# Patient Record
Sex: Male | Born: 1965
Health system: Southern US, Community
[De-identification: ages and names within clinical notes are randomized; demographics above are authoritative.]

## PROBLEM LIST (undated history)

## (undated) DIAGNOSIS — I471 Supraventricular tachycardia, unspecified: Secondary | ICD-10-CM

## (undated) DIAGNOSIS — F419 Anxiety disorder, unspecified: Secondary | ICD-10-CM

## (undated) DIAGNOSIS — I499 Cardiac arrhythmia, unspecified: Secondary | ICD-10-CM

## (undated) DIAGNOSIS — K219 Gastro-esophageal reflux disease without esophagitis: Secondary | ICD-10-CM

## (undated) HISTORY — PX: BACK SURGERY: SHX140

## (undated) HISTORY — DX: Supraventricular tachycardia: I47.1

## (undated) HISTORY — DX: Supraventricular tachycardia, unspecified: I47.10

## (undated) HISTORY — PX: TONSILLECTOMY: SUR1361

## (undated) HISTORY — DX: Gastro-esophageal reflux disease without esophagitis: K21.9

## (undated) HISTORY — DX: Anxiety disorder, unspecified: F41.9

---

## 2004-04-01 ENCOUNTER — Encounter: Admission: RE | Admit: 2004-04-01 | Discharge: 2004-04-01 | Payer: Self-pay | Admitting: General Surgery

## 2004-08-24 ENCOUNTER — Ambulatory Visit: Payer: Self-pay | Admitting: Gastroenterology

## 2004-09-11 HISTORY — PX: COLONOSCOPY: SHX174

## 2004-12-10 HISTORY — PX: LUMBAR DISC SURGERY: SHX700

## 2005-01-05 ENCOUNTER — Ambulatory Visit: Payer: Self-pay

## 2005-08-08 ENCOUNTER — Other Ambulatory Visit: Payer: Self-pay

## 2005-08-09 ENCOUNTER — Inpatient Hospital Stay: Payer: Self-pay | Admitting: Internal Medicine

## 2007-06-03 ENCOUNTER — Emergency Department (HOSPITAL_COMMUNITY): Admission: EM | Admit: 2007-06-03 | Discharge: 2007-06-03 | Payer: Self-pay | Admitting: Family Medicine

## 2009-01-20 ENCOUNTER — Encounter: Admission: RE | Admit: 2009-01-20 | Discharge: 2009-01-20 | Payer: Self-pay | Admitting: Internal Medicine

## 2009-11-16 ENCOUNTER — Emergency Department: Payer: Self-pay | Admitting: Emergency Medicine

## 2013-03-03 ENCOUNTER — Ambulatory Visit (INDEPENDENT_AMBULATORY_CARE_PROVIDER_SITE_OTHER): Payer: Managed Care, Other (non HMO) | Admitting: Family Medicine

## 2013-03-03 ENCOUNTER — Encounter: Payer: Self-pay | Admitting: Family Medicine

## 2013-03-03 VITALS — BP 118/82 | Temp 97.8°F | Resp 60 | Ht 68.0 in | Wt 187.0 lb

## 2013-03-03 DIAGNOSIS — I471 Supraventricular tachycardia, unspecified: Secondary | ICD-10-CM | POA: Insufficient documentation

## 2013-03-03 DIAGNOSIS — R5383 Other fatigue: Secondary | ICD-10-CM | POA: Insufficient documentation

## 2013-03-03 DIAGNOSIS — F411 Generalized anxiety disorder: Secondary | ICD-10-CM | POA: Insufficient documentation

## 2013-03-03 DIAGNOSIS — L600 Ingrowing nail: Secondary | ICD-10-CM

## 2013-03-03 DIAGNOSIS — R5381 Other malaise: Secondary | ICD-10-CM | POA: Insufficient documentation

## 2013-03-03 MED ORDER — FLUOCINONIDE-E 0.05 % EX CREA: TOPICAL_CREAM | Freq: Two times a day (BID) | CUTANEOUS | Status: DC

## 2013-03-03 NOTE — Progress Notes (Signed)
  Subjective:    Patient ID: Jesse Neal, male    DOB: [birth year], 47 y.o.   MRN: [id number]  HPI  Very pleasant 47 yo male here to establish care.  ?Low T- was told by previous PCP that testosterone was low but he never started testosterone replacement. He has had progressive fatigue and decreased sex drive this past year. No Cp, No SOB.  Anxiety- on celexa 10 mg daily, as needed clonazepam.  Has panic attacks but since starting celexa, these happen last frequently.  IBS- had a neg colonoscopy in 2006.  Total cholesterol checked at work - 174.  Paroxysmal SVT- followed by Dr. Waldron [facility].  Has had no issues- takes as needed metoprolol.  Patient Active Problem List   Diagnosis Date Noted  . Generalized anxiety disorder 03/03/2013  . Paroxysmal SVT (supraventricular tachycardia) 03/03/2013  . Other malaise and fatigue 03/03/2013  . Ingrown toenail 03/03/2013   Past Medical History  Diagnosis Date  . Anxiety   . SVT (supraventricular tachycardia)     Dr. Waldron [facility]   Past Surgical History  Procedure Laterality Date  . Tonsillectomy    . Back surgery  12/2004   History  Substance Use Topics  . Smoking status: Never Smoker   . Smokeless tobacco: Not on file  . Alcohol Use: Not on file   Family History  Problem Relation Age of Onset  . Cancer Father 30    Prostate  . Hyperlipidemia Father    No Known Allergies No current outpatient prescriptions on file prior to visit.   No current facility-administered medications on file prior to visit.   The PMH, PSH, Social History, Family History, Medications, and allergies have been reviewed in [facility], and have been updated if relevant.   Review of Systems    See HPI No depression No difficulties sleeping Objective:   Physical Exam BP 118/82  Temp(Src) 97.8 F (36.6 C)  Resp 60  Ht 5\' 8"  (1.727 m)  Wt 187 lb (84.823 kg)  BMI 28.44 kg/m2 General:  Pleasant male in NAD Eyes:  PERRL Ears:  External ear exam shows no  significant lesions or deformities.  Otoscopic examination reveals clear canals, tympanic membranes are intact bilaterally without bulging, retraction, inflammation or discharge. Hearing is grossly normal bilaterally. Nose:  External nasal examination shows no deformity or inflammation. Nasal mucosa are pink and moist without lesions or exudates. Mouth:  Oral mucosa and oropharynx without lesions or exudates.  Teeth in good repair. Neck:  no carotid bruit or thyromegaly no cervical or supraclavicular lymphadenopathy  Lungs:  Normal respiratory effort, chest expands symmetrically. Lungs are clear to auscultation, no crackles or wheezes. Heart:  Normal rate and regular rhythm. S1 and S2 normal without gallop, murmur, click, rub or other extra sounds. Abdomen:  Bowel sounds positive,abdomen soft and non-tender without masses, organomegaly or hernias noted. Pulses:  R and L posterior tibial pulses are full and equal bilaterally  Extremities:  no edema     Assessment & Plan:  1. Generalized anxiety disorder Stable on current meds.    2. Paroxysmal SVT (supraventricular tachycardia) Stable with as needed metoprolol.  3. Other malaise and fatigue Will check morning labs.   - Testosterone - Testosterone, free, total - PSA - CBC with Differential - Prolactin  4. Ingrown toenail Refer to podiatry.

## 2013-03-03 NOTE — Patient Instructions (Addendum)
Good to see you. Please come back for morning labs at your convenience.  See a podiatrist about your nail- Dr. Al Corpus.    Good luck with your audit. Have a great trip to [place]!  Try lidex when you need it (no more than two weeks at a time).

## 2013-03-06 ENCOUNTER — Other Ambulatory Visit (INDEPENDENT_AMBULATORY_CARE_PROVIDER_SITE_OTHER): Payer: Managed Care, Other (non HMO)

## 2013-03-06 DIAGNOSIS — R5381 Other malaise: Secondary | ICD-10-CM

## 2013-03-06 LAB — CBC WITH DIFFERENTIAL/PLATELET
Basophils Absolute: 0 10*3/uL (ref 0.0–0.1)
Basophils Relative: 0.8 % (ref 0.0–3.0)
Eosinophils Absolute: 0.1 10*3/uL (ref 0.0–0.7)
Eosinophils Relative: 1.4 % (ref 0.0–5.0)
HCT: 47.4 % (ref 39.0–52.0)
Hemoglobin: 16.1 g/dL (ref 13.0–17.0)
Lymphocytes Relative: 35.9 % (ref 12.0–46.0)
Lymphs Abs: 2.2 10*3/uL (ref 0.7–4.0)
MCHC: 34 g/dL (ref 30.0–36.0)
MCV: 91 fl (ref 78.0–100.0)
Monocytes Absolute: 0.4 10*3/uL (ref 0.1–1.0)
Monocytes Relative: 6.1 % (ref 3.0–12.0)
Neutro Abs: 3.4 10*3/uL (ref 1.4–7.7)
Neutrophils Relative %: 55.8 % (ref 43.0–77.0)
Platelets: 215 10*3/uL (ref 150.0–400.0)
RBC: 5.21 Mil/uL (ref 4.22–5.81)
RDW: 13.1 % (ref 11.5–14.6)
WBC: 6.1 10*3/uL (ref 4.5–10.5)

## 2013-03-06 LAB — PSA: PSA: 0.56 ng/mL (ref 0.10–4.00)

## 2013-03-07 ENCOUNTER — Encounter: Payer: Self-pay | Admitting: Family Medicine

## 2013-03-07 LAB — TESTOSTERONE, FREE, TOTAL, SHBG
Sex Hormone Binding: 31 nmol/L (ref 13–71)
Testosterone, Free: 49 pg/mL (ref 47.0–244.0)
Testosterone-% Free: 2 % (ref 1.6–2.9)
Testosterone: 245 ng/dL — ABNORMAL LOW (ref 300–890)

## 2013-03-07 LAB — PROLACTIN: Prolactin: 5.1 ng/mL (ref 2.1–17.1)

## 2013-03-13 ENCOUNTER — Encounter: Payer: Self-pay | Admitting: Family Medicine

## 2013-03-13 NOTE — Telephone Encounter (Signed)
Pt left v/m that he does want to start testosterone gel and send rx to [facility]. Pt call back is [phone].

## 2013-03-17 ENCOUNTER — Encounter: Payer: Self-pay | Admitting: Family Medicine

## 2013-03-17 ENCOUNTER — Other Ambulatory Visit: Payer: Self-pay | Admitting: Family Medicine

## 2013-03-17 MED ORDER — TESTOSTERONE 50 MG/5GM (1%) TD GEL: 5.0000 g | Freq: Every day | TRANSDERMAL | Status: DC

## 2013-03-17 NOTE — Telephone Encounter (Signed)
androgel called to [facility].

## 2013-03-18 ENCOUNTER — Telehealth: Payer: Self-pay

## 2013-03-18 ENCOUNTER — Encounter: Payer: Self-pay | Admitting: Family Medicine

## 2013-03-18 NOTE — Telephone Encounter (Signed)
Sarah with [employer] left v/m requesting clarification on androgel; there are 2 formulations of androgel. Does Dr Dayton Martes want pt on 1% or 1.62 % androgel pump.Please advise.

## 2013-03-18 NOTE — Telephone Encounter (Signed)
1%- see my chart message that I forwarded.

## 2013-03-18 NOTE — Telephone Encounter (Signed)
Advised pharmacist at [facility].

## 2013-03-19 NOTE — Telephone Encounter (Signed)
Dr Dayton Martes, prior Berkley Harvey is needed for androgel.  Insurance prefers androderm patches, axiron or Immunologist.  Do you want to change or try to get prior auth.  I have sent patient a my chart message asking if he has tried anything else.

## 2013-03-20 ENCOUNTER — Encounter: Payer: Self-pay | Admitting: Family Medicine

## 2013-03-21 ENCOUNTER — Other Ambulatory Visit: Payer: Self-pay | Admitting: Family Medicine

## 2013-03-21 MED ORDER — TESTOSTERONE 30 MG/ACT TD SOLN: 60.0000 mg | Freq: Every morning | TRANSDERMAL | Status: DC

## 2013-03-21 NOTE — Progress Notes (Signed)
Dr. Dayton Martes, do you want to prescribe a different testosterone for this patient?

## 2013-03-21 NOTE — Telephone Encounter (Signed)
I sent that info to you in another phone note, but I know they said androderm patches, axiron or fortesta

## 2013-03-21 NOTE — Progress Notes (Signed)
Yes I tried to send it to.  I wanted axiron.  Sorry if I did not send it right.

## 2013-03-24 NOTE — Telephone Encounter (Signed)
O

## 2013-03-25 ENCOUNTER — Encounter: Payer: Self-pay | Admitting: Family Medicine

## 2013-03-26 NOTE — Progress Notes (Signed)
Testosterone patches called to [facility].

## 2013-05-20 ENCOUNTER — Ambulatory Visit (INDEPENDENT_AMBULATORY_CARE_PROVIDER_SITE_OTHER): Payer: Managed Care, Other (non HMO) | Admitting: Family Medicine

## 2013-05-20 ENCOUNTER — Encounter: Payer: Self-pay | Admitting: Family Medicine

## 2013-05-20 VITALS — BP 102/80 | HR 62 | Temp 98.2°F | Ht 68.0 in | Wt 194.8 lb

## 2013-05-20 DIAGNOSIS — S30860A Insect bite (nonvenomous) of lower back and pelvis, initial encounter: Secondary | ICD-10-CM

## 2013-05-20 DIAGNOSIS — J309 Allergic rhinitis, unspecified: Secondary | ICD-10-CM

## 2013-05-20 DIAGNOSIS — S30861A Insect bite (nonvenomous) of abdominal wall, initial encounter: Secondary | ICD-10-CM

## 2013-05-20 NOTE — Patient Instructions (Addendum)
Benadryl at bedtime, claritin during the day. Saline irrigation. Topical steroid... Apply twice daily. Call if not improving in 48-72 hours, call sooner if new symptoms (redness spreading).  Go to ER if shortness of breathing or lip/tounge swelling.

## 2013-05-20 NOTE — Progress Notes (Signed)
  Subjective:    Patient ID: Jesse Neal, male    DOB: [birth year], 47 y.o.   MRN: [id number]  HPI  47 year old male presents following insect bIte on right abdomen.  He was cleaning out garage.. Later that day he had ear ache, sore throat, itchy skin. Mild flu like symptoms, mild nausea. No fever. No SOB, no cough. Noted redness in well circumscribed are on right lower abdomen, super itchy. No throat swelling or tounge/lip swelling.  He has been using benadryl.  No sick contacts.  Review of Systems  Constitutional: Negative for fever and fatigue.  HENT: Positive for ear pain and congestion.   Eyes: Negative for pain.  Respiratory: Negative for cough, shortness of breath and wheezing.   Cardiovascular: Negative for chest pain, palpitations and leg swelling.  Gastrointestinal: Negative for abdominal pain.       Objective:   Physical Exam  Constitutional: Vital signs are normal. He appears well-developed and well-nourished.  HENT:  Head: Normocephalic.  Right Ear: Hearing normal. Tympanic membrane is not erythematous. A middle ear effusion is present.  Left Ear: Hearing normal. Tympanic membrane is not erythematous.  Nose: Mucosal edema present. No rhinorrhea. Right sinus exhibits no maxillary sinus tenderness and no frontal sinus tenderness. Left sinus exhibits no maxillary sinus tenderness and no frontal sinus tenderness.  Mouth/Throat: Mucous membranes are normal. No oropharyngeal exudate, posterior oropharyngeal edema, posterior oropharyngeal erythema or tonsillar abscesses.  Neck: Trachea normal. Carotid bruit is not present. No mass and no thyromegaly present.  Cardiovascular: Normal rate, regular rhythm and normal pulses.  Exam reveals no gallop, no distant heart sounds and no friction rub.   No murmur heard. No peripheral edema  Pulmonary/Chest: Effort normal and breath sounds normal. No respiratory distress.  Skin: Skin is warm, dry and intact. No rash noted.   Psychiatric: He has a normal mood and affect. His speech is normal and behavior is normal. Thought content normal.          Assessment & Plan:  Local reaction of insect bite with mild systemic response. Treat with oral antihistamine and local steroid cream. HEENT symptoms likely due to allergy response to cleaning out dusty garage... Treat with nasal saline irrigation and antihistamine.

## 2013-05-26 ENCOUNTER — Ambulatory Visit (INDEPENDENT_AMBULATORY_CARE_PROVIDER_SITE_OTHER): Payer: Managed Care, Other (non HMO) | Admitting: Family Medicine

## 2013-05-26 ENCOUNTER — Encounter: Payer: Self-pay | Admitting: Family Medicine

## 2013-05-26 VITALS — BP 112/80 | HR 64 | Temp 97.6°F | Wt 193.0 lb

## 2013-05-26 DIAGNOSIS — E291 Testicular hypofunction: Secondary | ICD-10-CM

## 2013-05-26 DIAGNOSIS — R7989 Other specified abnormal findings of blood chemistry: Secondary | ICD-10-CM | POA: Insufficient documentation

## 2013-05-26 DIAGNOSIS — Z79899 Other long term (current) drug therapy: Secondary | ICD-10-CM

## 2013-05-26 LAB — CBC WITH DIFFERENTIAL/PLATELET
Basophils Absolute: 0.1 10*3/uL (ref 0.0–0.1)
Basophils Relative: 0.9 % (ref 0.0–3.0)
Eosinophils Absolute: 0.1 10*3/uL (ref 0.0–0.7)
Eosinophils Relative: 2 % (ref 0.0–5.0)
HCT: 44.8 % (ref 39.0–52.0)
Hemoglobin: 15.4 g/dL (ref 13.0–17.0)
Lymphocytes Relative: 37.6 % (ref 12.0–46.0)
Lymphs Abs: 2.4 10*3/uL (ref 0.7–4.0)
MCHC: 34.4 g/dL (ref 30.0–36.0)
MCV: 88.9 fl (ref 78.0–100.0)
Monocytes Absolute: 0.5 10*3/uL (ref 0.1–1.0)
Monocytes Relative: 7.4 % (ref 3.0–12.0)
Neutro Abs: 3.3 10*3/uL (ref 1.4–7.7)
Neutrophils Relative %: 52.1 % (ref 43.0–77.0)
Platelets: 241 10*3/uL (ref 150.0–400.0)
RBC: 5.04 Mil/uL (ref 4.22–5.81)
RDW: 12.9 % (ref 11.5–14.6)
WBC: 6.3 10*3/uL (ref 4.5–10.5)

## 2013-05-26 LAB — COMPREHENSIVE METABOLIC PANEL
ALT: 26 U/L (ref 0–53)
AST: 26 U/L (ref 0–37)
Albumin: 4.5 g/dL (ref 3.5–5.2)
Alkaline Phosphatase: 57 U/L (ref 39–117)
BUN: 16 mg/dL (ref 6–23)
CO2: 29 mEq/L (ref 19–32)
Calcium: 9.4 mg/dL (ref 8.4–10.5)
Chloride: 103 mEq/L (ref 96–112)
Creatinine, Ser: 1 mg/dL (ref 0.4–1.5)
GFR: 85.47 mL/min (ref >60.00)
Glucose, Bld: 93 mg/dL (ref 70–99)
Potassium: 4.3 mEq/L (ref 3.5–5.1)
Sodium: 137 mEq/L (ref 135–145)
Total Bilirubin: 0.7 mg/dL (ref 0.3–1.2)
Total Protein: 7.2 g/dL (ref 6.0–8.3)

## 2013-05-26 LAB — LIPID PANEL
Cholesterol: 186 mg/dL (ref 0–200)
HDL: 36.7 mg/dL — ABNORMAL LOW (ref >39.00)
LDL Cholesterol: 126 mg/dL — ABNORMAL HIGH (ref 0–99)
Total CHOL/HDL Ratio: 5
Triglycerides: 115 mg/dL (ref 0.0–149.0)
VLDL: 23 mg/dL (ref 0.0–40.0)

## 2013-05-26 LAB — HEMOGLOBIN A1C: Hgb A1c MFr Bld: 5.5 % (ref 4.6–6.5)

## 2013-05-26 LAB — PSA: PSA: 0.56 ng/mL (ref 0.10–4.00)

## 2013-05-26 MED ORDER — CITALOPRAM HYDROBROMIDE 10 MG PO TABS: 10.0000 mg | ORAL_TABLET | Freq: Every day | ORAL | Status: DC

## 2013-05-26 MED ORDER — CLONAZEPAM 0.5 MG PO TABS: ORAL_TABLET | ORAL | Status: DC

## 2013-05-26 NOTE — Progress Notes (Signed)
Subjective:    Patient ID: Jesse Neal, male    DOB: 03-16-1966, 47 y.o.   MRN: [id number]  HPI  Very pleasant 47 yo male here to follow up low testosterone.  When he established care in 02/2013, was told by previous PCP that he had low T.  He was complaining at that time of progressive fatigue and decreased sex drive this past year. No Cp, No SOB.  Testosterone was low.  We started topical testosterone replacement therapy.  He feels nightly erections has improved and maybe he has a little more energy. Otherwise, he is not sure if it is making too much of a difference.  Lab Results  Component Value Date   TESTOSTERONE 245* 03/06/2013     Patient Active Problem List   Diagnosis Date Noted  . Low testosterone 05/26/2013  . Insect bite of abdominal wall with local reaction 05/20/2013  . Allergic rhinitis 05/20/2013  . Generalized anxiety disorder 03/03/2013  . Paroxysmal SVT (supraventricular tachycardia) 03/03/2013  . Other malaise and fatigue 03/03/2013  . Ingrown toenail 03/03/2013   Past Medical History  Diagnosis Date  . Anxiety   . SVT (supraventricular tachycardia)     Dr. Waldron Labs   Past Surgical History  Procedure Laterality Date  . Tonsillectomy    . Back surgery  12/2004   History  Substance Use Topics  . Smoking status: Never Smoker   . Smokeless tobacco: Never Used  . Alcohol Use: Yes     Comment: rare   Family History  Problem Relation Age of Onset  . Cancer Father 21    Prostate  . Hyperlipidemia Father    No Known Allergies Current Outpatient Prescriptions on File Prior to Visit  Medication Sig Dispense Refill  . aspirin 81 MG tablet Take 81 mg by mouth daily.      . citalopram (CELEXA) 10 MG tablet Take 10 mg by mouth daily.      . clonazePAM (KLONOPIN) 0.5 MG tablet Take one half to one tablet by mouth daily      . fluocinonide-emollient (LIDEX-E) 0.05 % cream Apply topically 2 (two) times daily.  30 g  0  . metoprolol tartrate  (LOPRESSOR) 25 MG tablet Take one by mouth daily as needed for tachycardia      . Testosterone 30 MG/ACT SOLN Place 60 mg onto the skin every morning.  90 mL  3   No current facility-administered medications on file prior to visit.   The PMH, PSH, Social History, Family History, Medications, and allergies have been reviewed in [facility], and have been updated if relevant.   Review of Systems    See HPI No depression No difficulties sleeping Objective:   Physical Exam  BP 112/80  Pulse 64  Temp(Src) 97.6 F (36.4 C)  Wt 193 lb (87.544 kg)  BMI 29.35 kg/m2 General:  Pleasant male in NAD Eyes:  PERRL Ears:  External ear exam shows no significant lesions or deformities.  Otoscopic examination reveals clear canals, tympanic membranes are intact bilaterally without bulging, retraction, inflammation or discharge. Hearing is grossly normal bilaterally. Nose:  External nasal examination shows no deformity or inflammation. Nasal mucosa are pink and moist without lesions or exudates. Mouth:  Oral mucosa and oropharynx without lesions or exudates.  Teeth in good repair. Neck:  no carotid bruit or thyromegaly no cervical or supraclavicular lymphadenopathy  Lungs:  Normal respiratory effort, chest expands symmetrically. Lungs are clear to auscultation, no crackles or wheezes. Heart:  Normal rate and  regular rhythm. S1 and S2 normal without gallop, murmur, click, rub or other extra sounds. Abdomen:  Bowel sounds positive,abdomen soft and non-tender without masses, organomegaly or hernias noted. Pulses:  R and L posterior tibial pulses are full and equal bilaterally  Extremities:  no edema     Assessment & Plan:  1. Low testosterone With symptomatic improvement.  Recheck labs today. See below.  2. Encounter for long-term (current) use of high-risk medication  - Testosterone, free, total - PSA - CBC with Differential - Hemoglobin A1c - Comprehensive metabolic panel - Lipid Panel

## 2013-05-26 NOTE — Patient Instructions (Addendum)
Good to see you. We will call you with your lab results.   

## 2013-05-27 ENCOUNTER — Encounter: Payer: Self-pay | Admitting: Family Medicine

## 2013-05-27 ENCOUNTER — Other Ambulatory Visit: Payer: Self-pay | Admitting: Family Medicine

## 2013-05-27 DIAGNOSIS — D229 Melanocytic nevi, unspecified: Secondary | ICD-10-CM

## 2013-05-27 LAB — TESTOSTERONE, FREE, TOTAL, SHBG
Sex Hormone Binding: 28 nmol/L (ref 13–71)
Testosterone, Free: 220.7 pg/mL (ref 47.0–244.0)
Testosterone-% Free: 2.5 % (ref 1.6–2.9)
Testosterone: 868 ng/dL (ref 300–890)

## 2013-05-28 ENCOUNTER — Encounter: Payer: Self-pay | Admitting: Family Medicine

## 2013-07-17 ENCOUNTER — Other Ambulatory Visit: Payer: Self-pay

## 2013-09-24 ENCOUNTER — Ambulatory Visit (INDEPENDENT_AMBULATORY_CARE_PROVIDER_SITE_OTHER): Payer: Managed Care, Other (non HMO) | Admitting: Family Medicine

## 2013-09-24 ENCOUNTER — Encounter: Payer: Self-pay | Admitting: Family Medicine

## 2013-09-24 VITALS — BP 120/78 | HR 61 | Temp 97.9°F | Wt 181.0 lb

## 2013-09-24 DIAGNOSIS — R5383 Other fatigue: Secondary | ICD-10-CM

## 2013-09-24 DIAGNOSIS — M549 Dorsalgia, unspecified: Secondary | ICD-10-CM | POA: Insufficient documentation

## 2013-09-24 DIAGNOSIS — R5381 Other malaise: Secondary | ICD-10-CM

## 2013-09-24 DIAGNOSIS — E291 Testicular hypofunction: Secondary | ICD-10-CM

## 2013-09-24 DIAGNOSIS — R7989 Other specified abnormal findings of blood chemistry: Secondary | ICD-10-CM

## 2013-09-24 DIAGNOSIS — M2550 Pain in unspecified joint: Secondary | ICD-10-CM | POA: Insufficient documentation

## 2013-09-24 LAB — COMPREHENSIVE METABOLIC PANEL
ALT: 20 U/L (ref 0–53)
AST: 21 U/L (ref 0–37)
Albumin: 4.6 g/dL (ref 3.5–5.2)
Alkaline Phosphatase: 56 U/L (ref 39–117)
BUN: 11 mg/dL (ref 6–23)
CO2: 31 mEq/L (ref 19–32)
Calcium: 9.6 mg/dL (ref 8.4–10.5)
Chloride: 103 mEq/L (ref 96–112)
Creatinine, Ser: 1.1 mg/dL (ref 0.4–1.5)
GFR: 78.03 mL/min (ref >60.00)
Glucose, Bld: 91 mg/dL (ref 70–99)
Potassium: 4.3 mEq/L (ref 3.5–5.1)
Sodium: 139 mEq/L (ref 135–145)
Total Bilirubin: 0.9 mg/dL (ref 0.3–1.2)
Total Protein: 7.2 g/dL (ref 6.0–8.3)

## 2013-09-24 LAB — SEDIMENTATION RATE: Sed Rate: 1 mm/hr (ref 0–22)

## 2013-09-24 LAB — CBC WITH DIFFERENTIAL/PLATELET
Basophils Absolute: 0.1 10*3/uL (ref 0.0–0.1)
Basophils Relative: 1.1 % (ref 0.0–3.0)
Eosinophils Absolute: 0.1 10*3/uL (ref 0.0–0.7)
Eosinophils Relative: 1.7 % (ref 0.0–5.0)
HCT: 47.1 % (ref 39.0–52.0)
Hemoglobin: 16.4 g/dL (ref 13.0–17.0)
Lymphocytes Relative: 37.6 % (ref 12.0–46.0)
Lymphs Abs: 2.1 10*3/uL (ref 0.7–4.0)
MCHC: 34.8 g/dL (ref 30.0–36.0)
MCV: 87.8 fl (ref 78.0–100.0)
Monocytes Absolute: 0.3 10*3/uL (ref 0.1–1.0)
Monocytes Relative: 6.1 % (ref 3.0–12.0)
Neutro Abs: 3 10*3/uL (ref 1.4–7.7)
Neutrophils Relative %: 53.5 % (ref 43.0–77.0)
Platelets: 226 10*3/uL (ref 150.0–400.0)
RBC: 5.37 Mil/uL (ref 4.22–5.81)
RDW: 13.1 % (ref 11.5–14.6)
WBC: 5.6 10*3/uL (ref 4.5–10.5)

## 2013-09-24 LAB — VITAMIN B12: Vitamin B-12: 335 pg/mL (ref 211–911)

## 2013-09-24 LAB — RHEUMATOID FACTOR: Rhuematoid fact SerPl-aCnc: 10 IU/mL (ref <=14)

## 2013-09-24 LAB — PSA: PSA: 0.55 ng/mL (ref 0.10–4.00)

## 2013-09-24 LAB — TESTOSTERONE: Testosterone: 272.79 ng/dL — ABNORMAL LOW (ref 350.00–890.00)

## 2013-09-24 MED ORDER — OXYCODONE-ACETAMINOPHEN 5-325 MG PO TABS: 1.0000 | ORAL_TABLET | ORAL | Status: DC | PRN

## 2013-09-24 NOTE — Assessment & Plan Note (Signed)
Recheck Testosterone today. Fatigue may be multifactorial.  See below.

## 2013-09-24 NOTE — Assessment & Plan Note (Signed)
Deteriorated.  I am concerned there could be another underlying issue, given arthralgias as well. Will check rheum labs today.  May need rheum referral depending on results. Orders Placed This Encounter  Procedures  . CBC with Differential  . Testosterone  . PSA  . Comprehensive metabolic panel  . Sedimentation Rate  . Rheumatoid Factor  . B. Burgdorfi Antibodies  . Vitamin B12  . Vitamin D, 25-hydroxy

## 2013-09-24 NOTE — Assessment & Plan Note (Signed)
Consistent with muscle strain/spasm. Continue remaining active, prn flexeril. Also given #25 roxicet for severe pain prn.  He is aware of sedation and addiction potential.

## 2013-09-24 NOTE — Progress Notes (Signed)
Subjective:    Patient ID: Jesse Neal, male    DOB: 15-Jul-1966, 48 y.o.   MRN: [id number]  HPI  Very pleasant 48 yo male here to follow up low testosterone with complaint of back pain, fatigue and polyarthralgias.  Low T- on androgel.  Initially felt it helped with energy and ED.  Erections have improved but still very fatigued.  At times, he has skipped doses because he feels it is not working.   Lab Results  Component Value Date   TESTOSTERONE 868 05/26/2013   Fatigue- sleeping ok.  Starting exercise more- walks everyday.  Has lost weight intentionally. Wt Readings from Last 3 Encounters:  09/24/13 181 lb (82.101 kg)  05/26/13 193 lb (87.544 kg)  05/20/13 194 lb 12 oz (88.338 kg)   No matter how much he sleeps, still very tired.  No CP or SOB.  Denies feeling depressed.  Poly arthralgias- last several months, he feels "achy all over."  Sometimes in his feet, shoulders, back, knees.  No known injuries.  Joints are never hot or red.  Low back pain- right sided back pain for a few days.  Feels like back spasms he has had in past.  Went to massage therapist yesterday which did help.  No tingling or weakness of extremities.   Lab Results  Component Value Date   WBC 6.3 05/26/2013   HGB 15.4 05/26/2013   HCT 44.8 05/26/2013   MCV 88.9 05/26/2013   PLT 241.0 05/26/2013   Lab Results  Component Value Date   PSA 0.56 05/26/2013   PSA 0.56 03/06/2013    Patient Active Problem List   Diagnosis Date Noted  . Back pain 09/24/2013  . Low testosterone 05/26/2013  . Allergic rhinitis 05/20/2013  . Generalized anxiety disorder 03/03/2013  . Paroxysmal SVT (supraventricular tachycardia) 03/03/2013   Past Medical History  Diagnosis Date  . Anxiety   . SVT (supraventricular tachycardia)     Dr. Gigi Gin   Past Surgical History  Procedure Laterality Date  . Tonsillectomy    . Back surgery  12/2004   History  Substance Use Topics  . Smoking status: Never Smoker   . Smokeless  tobacco: Never Used  . Alcohol Use: Yes     Comment: rare   Family History  Problem Relation Age of Onset  . Cancer Father 43    Prostate  . Hyperlipidemia Father    No Known Allergies Current Outpatient Prescriptions on File Prior to Visit  Medication Sig Dispense Refill  . aspirin 81 MG tablet Take 81 mg by mouth daily.      . citalopram (CELEXA) 10 MG tablet Take 1 tablet (10 mg total) by mouth daily.  90 tablet  3  . clonazePAM (KLONOPIN) 0.5 MG tablet Take one half to one tablet by mouth daily  30 tablet  0  . fluocinonide-emollient (LIDEX-E) 0.05 % cream Apply topically 2 (two) times daily.  30 g  0  . metoprolol tartrate (LOPRESSOR) 25 MG tablet Take one by mouth daily as needed for tachycardia      . Testosterone 30 MG/ACT SOLN Place 60 mg onto the skin every morning.  90 mL  3   No current facility-administered medications on file prior to visit.   The PMH, PSH, Social History, Family History, Medications, and allergies have been reviewed in [facility], and have been updated if relevant.   Review of Systems    See HPI No depression No difficulties sleeping Objective:   Physical Exam  BP 120/78  Pulse 61  Temp(Src) 97.9 F (36.6 C) (Oral)  Wt 181 lb (82.101 kg)  SpO2 99% General:  Pleasant male in NAD Eyes:  PERRL Ears:  External ear exam shows no significant lesions or deformities.  Otoscopic examination reveals clear canals, tympanic membranes are intact bilaterally without bulging, retraction, inflammation or discharge. Hearing is grossly normal bilaterally. Nose:  External nasal examination shows no deformity or inflammation. Nasal mucosa are pink and moist without lesions or exudates. Mouth:  Oral mucosa and oropharynx without lesions or exudates.  Teeth in good repair. Neck:  no carotid bruit or thyromegaly no cervical or supraclavicular lymphadenopathy  Lungs:  Normal respiratory effort, chest expands symmetrically. Lungs are clear to auscultation, no crackles or  wheezes. Heart:  Normal rate and regular rhythm. S1 and S2 normal without gallop, murmur, click, rub or other extra sounds. Abdomen:  Bowel sounds positive,abdomen soft and non-tender without masses, organomegaly or hernias noted. Pulses:  R and L posterior tibial pulses are full and equal bilaterally  Extremities:  no edema  No erythema of joints Tight paraspinous muscles, no TTP over spine, neg SLR bilaterally, normal reflexes Normal gait    Assessment & Plan:

## 2013-09-24 NOTE — Progress Notes (Signed)
Pre-visit discussion using our clinic review tool. No additional management support is needed unless otherwise documented below in the visit note.  

## 2013-09-24 NOTE — Assessment & Plan Note (Signed)
See above.  Start with labs today. The patient indicates understanding of these issues and agrees with the plan.

## 2013-09-24 NOTE — Patient Instructions (Signed)
Great to see you. I will call you with your lab results- probably on Friday.  At that point, we can decide what our next step should be. Ok to take flexeril or percocet as needed.

## 2013-09-25 LAB — VITAMIN D 25 HYDROXY (VIT D DEFICIENCY, FRACTURES): Vit D, 25-Hydroxy: 40 ng/mL (ref 30–89)

## 2013-09-25 LAB — B. BURGDORFI ANTIBODIES: B burgdorferi Ab IgG+IgM: 0.41 {ISR}

## 2013-09-26 ENCOUNTER — Encounter: Payer: Self-pay | Admitting: Family Medicine

## 2013-09-30 ENCOUNTER — Telehealth: Payer: Self-pay

## 2013-09-30 MED ORDER — TESTOSTERONE 50 MG/5GM (1%) TD GEL: 5.0000 g | Freq: Every day | TRANSDERMAL | Status: DC

## 2013-09-30 NOTE — Telephone Encounter (Signed)
Spoke to pt and informed him that Rx has been phoned in to requested pharmacy

## 2013-09-30 NOTE — Telephone Encounter (Signed)
[employer] request clarification of name brand for testosterone gel. Does Dr Deborra Medina want Testim (not covered by insurance) or Axiron(last testosterone pt was on). [place] request cb.

## 2013-10-01 NOTE — Telephone Encounter (Signed)
Spoke to [employer] who states that a pre-auth is required for all testosterone and she will fax it to the office for completion

## 2013-10-01 NOTE — Telephone Encounter (Signed)
He felt axiron was not working well for him.  Is there another testosterone prep his insurance would cover (not injection)?

## 2013-10-07 ENCOUNTER — Other Ambulatory Visit: Payer: Self-pay | Admitting: Family Medicine

## 2013-10-07 MED ORDER — TESTOSTERONE 30 MG/ACT TD SOLN: 60.0000 mg | Freq: Every morning | TRANSDERMAL | Status: DC

## 2013-10-11 ENCOUNTER — Other Ambulatory Visit: Payer: Self-pay | Admitting: Family Medicine

## 2013-10-20 ENCOUNTER — Other Ambulatory Visit: Payer: Self-pay | Admitting: Family Medicine

## 2013-10-20 NOTE — Progress Notes (Signed)
Please call in testosterone rx as entered below.

## 2013-10-22 MED ORDER — TESTOSTERONE 30 MG/ACT TD SOLN: 60.0000 mg | Freq: Every morning | TRANSDERMAL | Status: DC

## 2013-10-22 NOTE — Telephone Encounter (Signed)
Lm on pts vm and informed him Rx has been called into requested pharmacy

## 2013-11-12 ENCOUNTER — Encounter: Payer: Self-pay | Admitting: Family Medicine

## 2013-12-11 ENCOUNTER — Other Ambulatory Visit: Payer: Self-pay | Admitting: Family Medicine

## 2013-12-11 MED ORDER — CITALOPRAM HYDROBROMIDE 10 MG PO TABS: 10.0000 mg | ORAL_TABLET | Freq: Every day | ORAL | Status: DC

## 2013-12-11 MED ORDER — CLONAZEPAM 0.5 MG PO TABS: ORAL_TABLET | ORAL | Status: DC

## 2013-12-11 NOTE — Telephone Encounter (Signed)
Lm on pts vm informing him Rx has been called in to requested pharmacy; also responded to through [employer]

## 2013-12-11 NOTE — Telephone Encounter (Signed)
Pt requesting medication refill. Last ov 09/2013 with no future appt scheduled. pls advise 

## 2014-01-15 ENCOUNTER — Other Ambulatory Visit: Payer: Self-pay | Admitting: Family Medicine

## 2014-01-15 ENCOUNTER — Encounter: Payer: Self-pay | Admitting: Family Medicine

## 2014-01-15 ENCOUNTER — Ambulatory Visit (INDEPENDENT_AMBULATORY_CARE_PROVIDER_SITE_OTHER): Payer: Managed Care, Other (non HMO) | Admitting: Family Medicine

## 2014-01-15 ENCOUNTER — Ambulatory Visit (INDEPENDENT_AMBULATORY_CARE_PROVIDER_SITE_OTHER)
Admission: RE | Admit: 2014-01-15 | Discharge: 2014-01-15 | Disposition: A | Discharge status: Home / Self Care | Payer: Managed Care, Other (non HMO) | Source: Ambulatory Visit | Attending: Family Medicine | Admitting: Family Medicine

## 2014-01-15 VITALS — BP 116/68 | HR 72 | Temp 97.9°F | Ht 68.0 in | Wt 180.2 lb

## 2014-01-15 DIAGNOSIS — IMO0002 Reserved for concepts with insufficient information to code with codable children: Secondary | ICD-10-CM

## 2014-01-15 DIAGNOSIS — M5416 Radiculopathy, lumbar region: Secondary | ICD-10-CM

## 2014-01-15 DIAGNOSIS — M549 Dorsalgia, unspecified: Secondary | ICD-10-CM

## 2014-01-15 MED ORDER — OXYCODONE-ACETAMINOPHEN 5-325 MG PO TABS: 1.0000 | ORAL_TABLET | ORAL | Status: DC | PRN

## 2014-01-15 MED ORDER — PREDNISONE 10 MG PO TABS: ORAL_TABLET | ORAL | Status: DC

## 2014-01-15 NOTE — Patient Instructions (Signed)
Please take prednisone as directed- with food and in the morning.  We will call you with your xray results and physical therapy appointment.

## 2014-01-15 NOTE — Progress Notes (Signed)
Pre visit review using our clinic review tool, if applicable. No additional management support is needed unless otherwise documented below in the visit note. 

## 2014-01-15 NOTE — Telephone Encounter (Signed)
Sent patient message back thru my-chart, that prescription is ready for pick-up and will be at the front desk.  

## 2014-01-15 NOTE — Progress Notes (Signed)
SUBJECTIVE:  Jesse Neal is a 48 y.o. male who complains of low back pain for 5 month(s), positional with bending or lifting, with radiation down the legs. Precipitating factors: none recalled by the patient. Prior history of back problems: recurrent self limited episodes of low back pain in the past and previous spinal surgery - diskectomy in 2006. There is no numbness in the legs.  Patient Active Problem List   Diagnosis Date Noted  . Lumbar back pain with radiculopathy affecting right lower extremity 01/15/2014  . Back pain 09/24/2013  . Fatigue 09/24/2013  . Polyarthralgia 09/24/2013  . Low testosterone 05/26/2013  . Allergic rhinitis 05/20/2013  . Generalized anxiety disorder 03/03/2013  . Paroxysmal SVT (supraventricular tachycardia) 03/03/2013   Past Medical History  Diagnosis Date  . Anxiety   . SVT (supraventricular tachycardia)     Dr. Gigi Gin   Past Surgical History  Procedure Laterality Date  . Tonsillectomy    . Back surgery  12/2004   History  Substance Use Topics  . Smoking status: Never Smoker   . Smokeless tobacco: Never Used  . Alcohol Use: Yes     Comment: rare   Family History  Problem Relation Age of Onset  . Cancer Father 3    Prostate  . Hyperlipidemia Father    No Known Allergies Current Outpatient Prescriptions on File Prior to Visit  Medication Sig Dispense Refill  . aspirin 81 MG tablet Take 81 mg by mouth daily.      . citalopram (CELEXA) 10 MG tablet Take 1 tablet (10 mg total) by mouth daily.  90 tablet  3  . clonazePAM (KLONOPIN) 0.5 MG tablet Take one half to one tablet by mouth daily  30 tablet  0  . fluocinonide-emollient (LIDEX-E) 0.05 % cream Apply topically 2 (two) times daily.  30 g  0  . metoprolol tartrate (LOPRESSOR) 25 MG tablet Take one by mouth daily as needed for tachycardia      . oxyCODONE-acetaminophen (ROXICET) 5-325 MG per tablet Take 1-2 tablets by mouth every 4 (four) hours as needed for severe pain.  25 tablet   0  . Testosterone 30 MG/ACT SOLN Place 60 mg onto the skin every morning.  90 mL  3   No current facility-administered medications on file prior to visit.   The PMH, PSH, Social History, Family History, Medications, and allergies have been reviewed in [facility], and have been updated if relevant.  OBJECTIVE: BP 116/68  Pulse 72  Temp(Src) 97.9 F (36.6 C) (Oral)  Ht 5\' 8"  (1.727 m)  Wt 180 lb 4 oz (81.761 kg)  BMI 27.41 kg/m2  SpO2 97%  Patient appears to be in mild to moderate pain, antalgic gait noted. Lumbosacral spine area reveals no local tenderness or mass.  Painful and reduced LS ROM noted. Straight leg raise is positive at 90 degrees on right. DTR's, motor strength and sensation normal, including heel and toe gait.  Peripheral pulses are palpable. X-Ray: ordered, but results not yet available.  ASSESSMENT:  herniated disc likely at L4-5 and with radiculopathy  PLAN: Prednisone taper, xray, PT referral. For acute pain, rest, intermittent application of heat (do not sleep on heating pad), analgesics and muscle relaxants are recommended.  May need MRI given h/o surgery. The patient indicates understanding of these issues and agrees with the plan.

## 2014-01-15 NOTE — Telephone Encounter (Signed)
Yes ok to take with prednisone.

## 2014-01-19 NOTE — Telephone Encounter (Signed)
This was back in Jan before the clinical staff started doing them. Do you know anything about this?

## 2014-01-21 NOTE — Telephone Encounter (Signed)
I went through pt's mychart messages, and it appears an [place] was never done. The pt decided to change med instead of obtaining an [place] for the old med.

## 2014-02-17 ENCOUNTER — Other Ambulatory Visit: Payer: Self-pay | Admitting: Family Medicine

## 2014-05-21 ENCOUNTER — Ambulatory Visit (INDEPENDENT_AMBULATORY_CARE_PROVIDER_SITE_OTHER): Payer: Managed Care, Other (non HMO) | Admitting: Family Medicine

## 2014-05-21 ENCOUNTER — Encounter: Payer: Self-pay | Admitting: Family Medicine

## 2014-05-21 VITALS — BP 100/70 | HR 75 | Temp 97.7°F | Wt 188.0 lb

## 2014-05-21 DIAGNOSIS — J0120 Acute ethmoidal sinusitis, unspecified: Secondary | ICD-10-CM

## 2014-05-21 MED ORDER — HYDROCODONE-HOMATROPINE 5-1.5 MG/5ML PO SYRP: 5.0000 mL | ORAL_SOLUTION | Freq: Three times a day (TID) | ORAL | Status: DC | PRN

## 2014-05-21 MED ORDER — AMOXICILLIN-POT CLAVULANATE 875-125 MG PO TABS: 1.0000 | ORAL_TABLET | Freq: Two times a day (BID) | ORAL | Status: AC

## 2014-05-21 NOTE — Progress Notes (Signed)
SUBJECTIVE:  Jesse Neal is a 48 y.o. male who complains of coryza, congestion, sneezing, sore throat, myalgias, headache and bilateral sinus pain for 8 days. He denies a history of chest pain, shortness of breath and vomiting and denies a history of asthma. Patient denies smoke cigarettes.   Current Outpatient Prescriptions on File Prior to Visit  Medication Sig Dispense Refill  . citalopram (CELEXA) 10 MG tablet Take 1 tablet (10 mg total) by mouth daily.  90 tablet  3  . clonazePAM (KLONOPIN) 0.5 MG tablet Take one half to one tablet by mouth daily  30 tablet  0   No current facility-administered medications on file prior to visit.    No Known Allergies  Past Medical History  Diagnosis Date  . Anxiety   . SVT (supraventricular tachycardia)     Dr. Gigi Gin    Past Surgical History  Procedure Laterality Date  . Tonsillectomy    . Back surgery  12/2004    Family History  Problem Relation Age of Onset  . Cancer Father 15    Prostate  . Hyperlipidemia Father     History   Social History  . Marital Status: Married    Spouse Name: N/A    Number of Children: N/A  . Years of Education: N/A   Occupational History  . Not on file.   Social History Main Topics  . Smoking status: Never Smoker   . Smokeless tobacco: Never Used  . Alcohol Use: Yes     Comment: rare  . Drug Use: No  . Sexual Activity: Not on file   Other Topics Concern  . 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   The PMH, PSH, Social History, Family History, Medications, and allergies have been reviewed in [facility], and have been updated if relevant.  OBJECTIVE: BP 100/70  Pulse 75  Temp(Src) 97.7 F (36.5 C) (Tympanic)  Wt 188 lb (85.276 kg)  SpO2 98%  He appears well, vital signs are as noted. Ears normal.  Throat and pharynx normal.  Neck supple. No adenopathy in the neck. Nose is congested. Sinuses tender. The chest is clear,  without wheezes or rales.  ASSESSMENT:  sinusitis  PLAN: Given duration and progression of symptoms, will treat for bacterial sinusitis with Augmentin. Rx given to pt for prn hycodan- discussed sedation precautions.  Symptomatic therapy suggested: push fluids, rest and return office visit prn if symptoms persist or worsen. Call or return to clinic prn if these symptoms worsen or fail to improve as anticipated.

## 2014-05-21 NOTE — Patient Instructions (Signed)
Great to see you. Good luck with your new job.  Take Augmentin as directed as directed.  Drink lots of fluids.    Treat sympotmatically with Mucinex, nasal saline irrigation, and Tylenol/Ibuprofen.  Continue  over the counter nasocort-start with 2 sprays per nostril per day...and then try to taper to 1 spray per nostril once symptoms improve.   You can use warm compresses.  Cough suppressant at night.   Call if not improving as expected in 5-7 days.

## 2014-05-21 NOTE — Progress Notes (Signed)
Pre visit review using our clinic review tool, if applicable. No additional management support is needed unless otherwise documented below in the visit note. 

## 2014-06-26 ENCOUNTER — Other Ambulatory Visit: Payer: Self-pay

## 2014-06-30 ENCOUNTER — Encounter: Payer: Self-pay | Admitting: Internal Medicine

## 2014-08-10 ENCOUNTER — Encounter: Payer: Self-pay | Admitting: Family Medicine

## 2014-08-10 ENCOUNTER — Ambulatory Visit (INDEPENDENT_AMBULATORY_CARE_PROVIDER_SITE_OTHER): Payer: BC Managed Care – PPO | Admitting: Family Medicine

## 2014-08-10 VITALS — BP 122/70 | HR 73 | Temp 97.7°F | Wt 193.0 lb

## 2014-08-10 DIAGNOSIS — M5441 Lumbago with sciatica, right side: Secondary | ICD-10-CM

## 2014-08-10 DIAGNOSIS — Z23 Encounter for immunization: Secondary | ICD-10-CM

## 2014-08-10 MED ORDER — OXYCODONE-ACETAMINOPHEN 5-325 MG PO TABS: 1.0000 | ORAL_TABLET | ORAL | Status: DC | PRN

## 2014-08-10 MED ORDER — PREDNISONE 10 MG PO TABS: ORAL_TABLET | ORAL | Status: DC

## 2014-08-10 NOTE — Assessment & Plan Note (Signed)
New- he did get some relief with massage this am. Prednisone course, percocet as needed for severe pain. Start home exercises he learned at PT. Call or return to clinic prn if these symptoms worsen or fail to improve as anticipated. The patient indicates understanding of these issues and agrees with the plan.

## 2014-08-10 NOTE — Patient Instructions (Signed)
Great to see you. Take prednisone as directed, Roxicet as needed for severe pain.  Start your home exercises.

## 2014-08-10 NOTE — Progress Notes (Signed)
SUBJECTIVE:  Jesse Neal is a 48 y.o. male who complains of low back pain for 3 day(s), positional with bending or lifting, with radiation down the legs. Precipitating factors: none recalled by the patient. Prior history of back problems: recurrent self limited episodes of low back pain in the past. There is no numbness in the legs.  I saw him in May 2015 for similar issue. Given prednisone, PT referral and Xray done. Did feel PT was helpful.  Xray from 01/15/14 showed: CLINICAL DATA: Low back pain.  EXAM: LUMBAR SPINE - COMPLETE 4+ VIEW  COMPARISON: None.  FINDINGS: Diffuse degenerative change with multilevel disc degeneration and endplate osteophyte formation. No acute abnormality identified. No evidence of fracture. Good anatomic alignment. Normal bony mineralization.  IMPRESSION: Diffuse degenerative change. No acute abnormality identified.  Current Outpatient Prescriptions on File Prior to Visit  Medication Sig Dispense Refill  . citalopram (CELEXA) 10 MG tablet Take 1 tablet (10 mg total) by mouth daily. 90 tablet 3  . clonazePAM (KLONOPIN) 0.5 MG tablet Take one half to one tablet by mouth daily 30 tablet 0   No current facility-administered medications on file prior to visit.    No Known Allergies  Past Medical History  Diagnosis Date  . Anxiety   . SVT (supraventricular tachycardia)     Dr. Gigi Gin    Past Surgical History  Procedure Laterality Date  . Tonsillectomy    . Back surgery  12/2004    Family History  Problem Relation Age of Onset  . Cancer Father 47    Prostate  . Hyperlipidemia Father     History   Social History  . Marital Status: Married    Spouse Name: N/A    Number of Children: N/A  . Years of Education: N/A   Occupational History  . Not on file.   Social History Main Topics  . Smoking status: Never Smoker   . Smokeless tobacco: Never Used  . Alcohol Use: Yes     Comment: rare  . Drug Use: No  . Sexual  Activity: Not on file   Other Topics Concern  . 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   The PMH, PSH, Social History, Family History, Medications, and allergies have been reviewed in [facility], and have been updated if relevant.  OBJECTIVE: BP 122/70 mmHg  Pulse 73  Temp(Src) 97.7 F (36.5 C) (Oral)  Wt 193 lb (87.544 kg)  SpO2 97%  Patient appears to be in mild to moderate pain, antalgic gait noted. Lumbosacral spine area reveals no local tenderness or mass.  Painful and reduced LS ROM noted. Straight leg raise is negative at 90 degrees on right. DTR's, motor strength and sensation normal, including heel and toe gait.  Peripheral pulses are palpable. X-Ray: not indicated.

## 2014-08-10 NOTE — Progress Notes (Signed)
Pre visit review using our clinic review tool, if applicable. No additional management support is needed unless otherwise documented below in the visit note. 

## 2014-08-24 ENCOUNTER — Encounter: Payer: Self-pay | Admitting: Internal Medicine

## 2014-09-06 IMAGING — CR DG LUMBAR SPINE COMPLETE 4+V
5 series · 5 of 5 positions shown · non-contrast
Comparison: None.

CLINICAL DATA: Low back pain.

EXAM:
LUMBAR SPINE - COMPLETE 4+ VIEW

[view not recorded (1 of 5)]
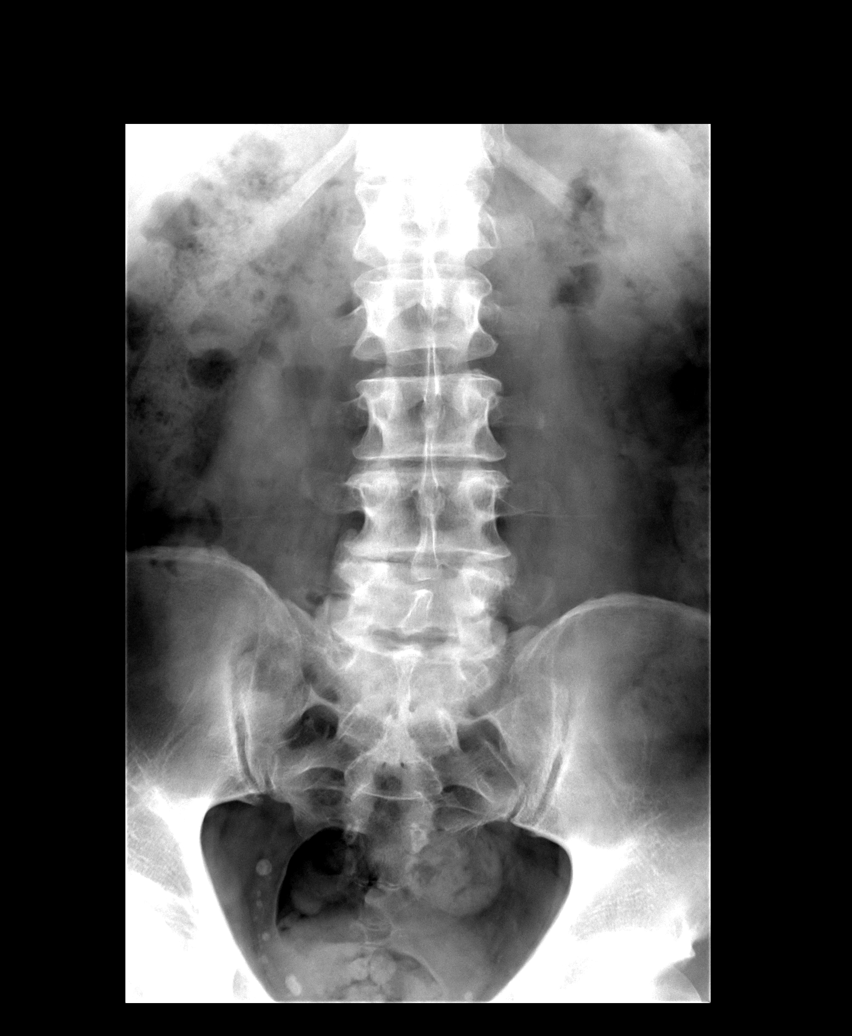

[view not recorded (2 of 5)]
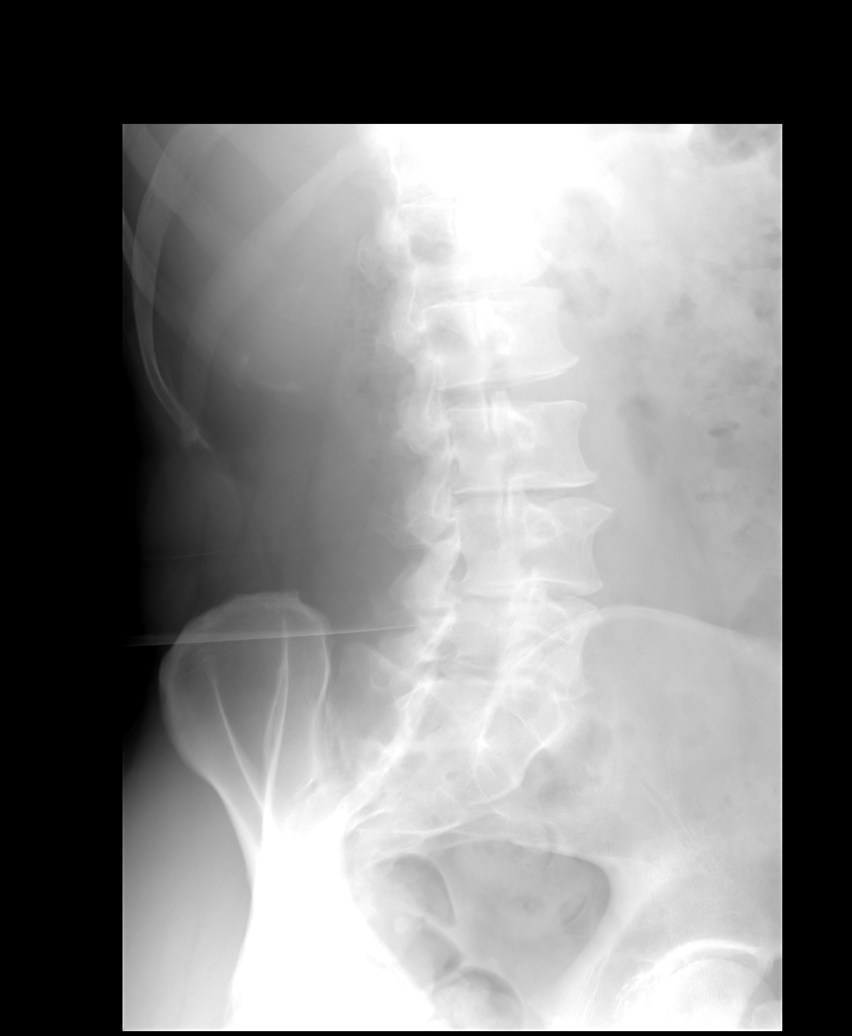

[view not recorded (3 of 5)]
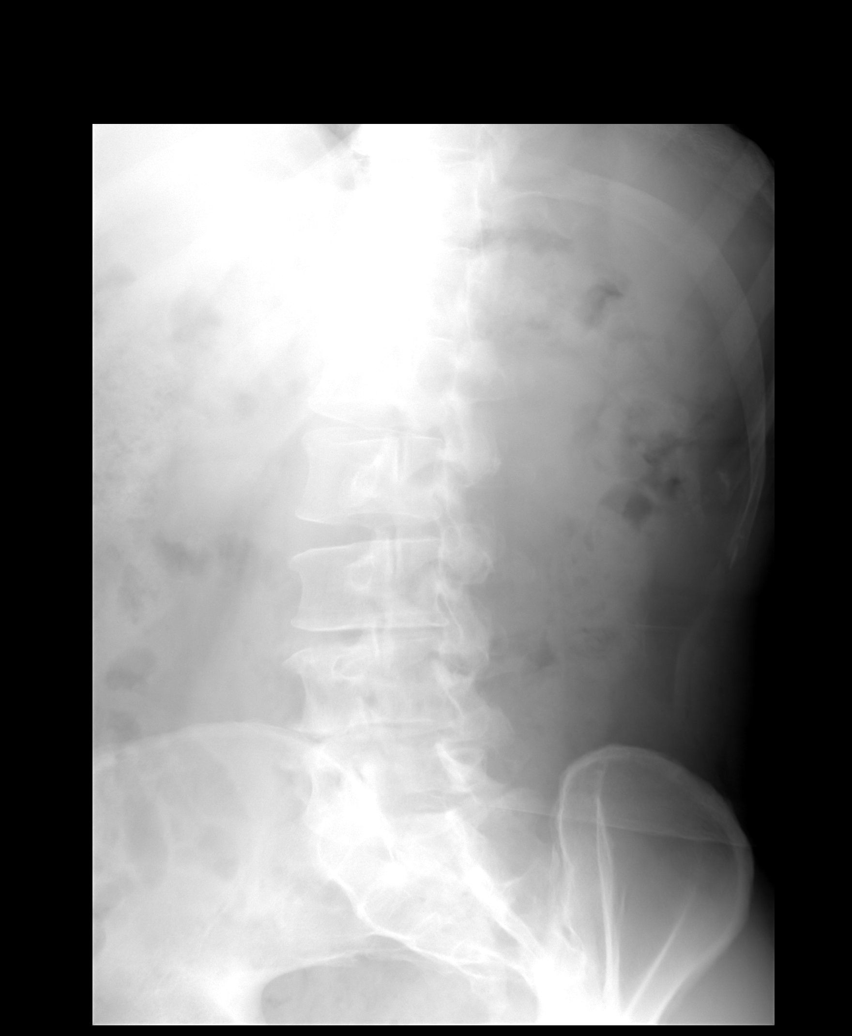

[view not recorded (4 of 5)]
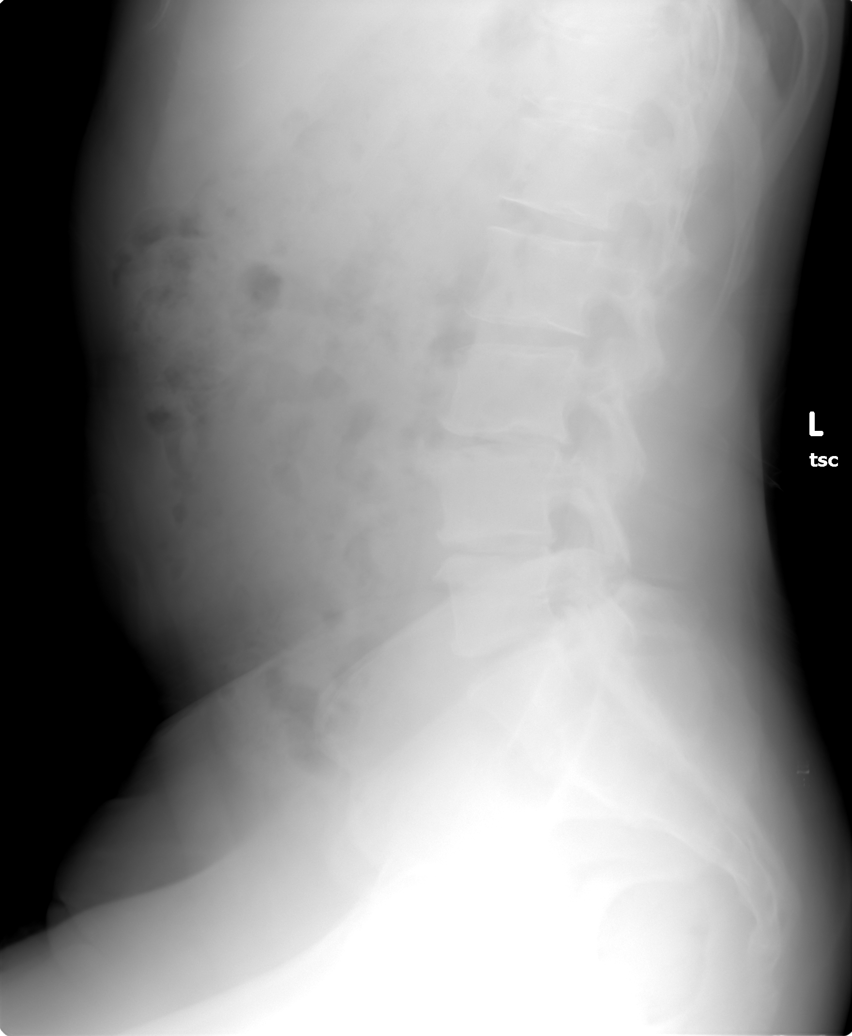

[view not recorded (5 of 5)]
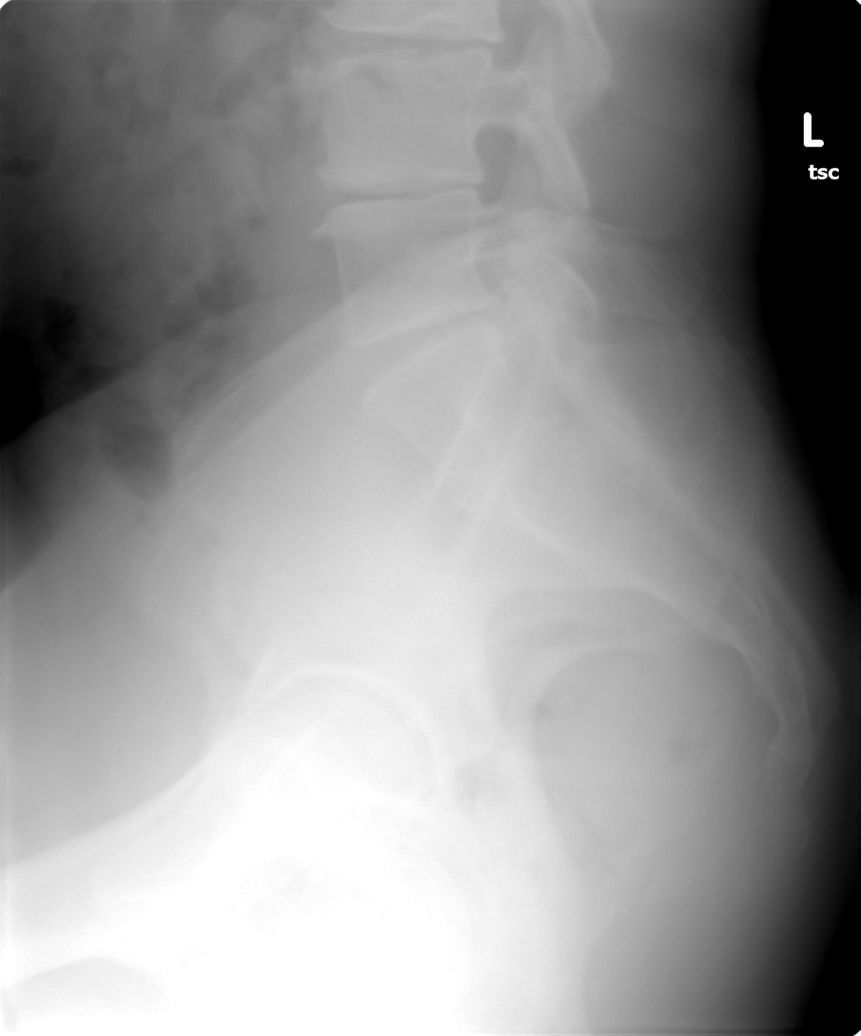

[5 of 5 positions shown; findings below may reference images not displayed]

FINDINGS: Diffuse degenerative change with multilevel disc degeneration and
endplate osteophyte formation. No acute abnormality identified. No
evidence of fracture. Good anatomic alignment. Normal bony
mineralization.
IMPRESSION: Diffuse degenerative change.  No acute abnormality identified.

## 2014-11-27 ENCOUNTER — Other Ambulatory Visit: Payer: Self-pay | Admitting: Family Medicine

## 2014-11-27 ENCOUNTER — Other Ambulatory Visit (INDEPENDENT_AMBULATORY_CARE_PROVIDER_SITE_OTHER): Payer: BLUE CROSS/BLUE SHIELD

## 2014-11-27 DIAGNOSIS — Z0000 Encounter for general adult medical examination without abnormal findings: Secondary | ICD-10-CM

## 2014-11-27 LAB — LIPID PANEL
Cholesterol: 191 mg/dL (ref 0–200)
HDL: 39.4 mg/dL (ref >39.00)
LDL Cholesterol: 132 mg/dL — ABNORMAL HIGH (ref 0–99)
NonHDL: 151.6
Total CHOL/HDL Ratio: 5
Triglycerides: 98 mg/dL (ref 0.0–149.0)
VLDL: 19.6 mg/dL (ref 0.0–40.0)

## 2014-11-27 LAB — COMPREHENSIVE METABOLIC PANEL
ALT: 18 U/L (ref 0–53)
AST: 17 U/L (ref 0–37)
Albumin: 4.5 g/dL (ref 3.5–5.2)
Alkaline Phosphatase: 59 U/L (ref 39–117)
BUN: 17 mg/dL (ref 6–23)
CO2: 31 mEq/L (ref 19–32)
Calcium: 9.3 mg/dL (ref 8.4–10.5)
Chloride: 105 mEq/L (ref 96–112)
Creatinine, Ser: 1.07 mg/dL (ref 0.40–1.50)
GFR: 77.66 mL/min (ref >60.00)
Glucose, Bld: 99 mg/dL (ref 70–99)
Potassium: 4.3 mEq/L (ref 3.5–5.1)
Sodium: 139 mEq/L (ref 135–145)
Total Bilirubin: 0.6 mg/dL (ref 0.2–1.2)
Total Protein: 6.8 g/dL (ref 6.0–8.3)

## 2014-11-27 LAB — CBC WITH DIFFERENTIAL/PLATELET
Basophils Absolute: 0.1 10*3/uL (ref 0.0–0.1)
Basophils Relative: 0.9 % (ref 0.0–3.0)
Eosinophils Absolute: 0.1 10*3/uL (ref 0.0–0.7)
Eosinophils Relative: 1.8 % (ref 0.0–5.0)
HCT: 45.8 % (ref 39.0–52.0)
Hemoglobin: 15.8 g/dL (ref 13.0–17.0)
Lymphocytes Relative: 43.1 % (ref 12.0–46.0)
Lymphs Abs: 2.5 10*3/uL (ref 0.7–4.0)
MCHC: 34.5 g/dL (ref 30.0–36.0)
MCV: 87.3 fl (ref 78.0–100.0)
Monocytes Absolute: 0.3 10*3/uL (ref 0.1–1.0)
Monocytes Relative: 5.7 % (ref 3.0–12.0)
Neutro Abs: 2.9 10*3/uL (ref 1.4–7.7)
Neutrophils Relative %: 48.5 % (ref 43.0–77.0)
Platelets: 209 10*3/uL (ref 150.0–400.0)
RBC: 5.25 Mil/uL (ref 4.22–5.81)
RDW: 12.8 % (ref 11.5–15.5)
WBC: 5.9 10*3/uL (ref 4.0–10.5)

## 2014-11-27 LAB — PSA: PSA: 0.61 ng/mL (ref 0.10–4.00)

## 2014-12-02 ENCOUNTER — Ambulatory Visit (INDEPENDENT_AMBULATORY_CARE_PROVIDER_SITE_OTHER): Payer: BLUE CROSS/BLUE SHIELD | Admitting: Family Medicine

## 2014-12-02 ENCOUNTER — Encounter: Payer: Self-pay | Admitting: Family Medicine

## 2014-12-02 ENCOUNTER — Other Ambulatory Visit: Payer: Self-pay | Admitting: Family Medicine

## 2014-12-02 VITALS — BP 106/78 | HR 84 | Temp 97.7°F | Resp 16 | Wt 189.8 lb

## 2014-12-02 DIAGNOSIS — M158 Other polyosteoarthritis: Secondary | ICD-10-CM | POA: Diagnosis not present

## 2014-12-02 DIAGNOSIS — M1990 Unspecified osteoarthritis, unspecified site: Secondary | ICD-10-CM | POA: Insufficient documentation

## 2014-12-02 DIAGNOSIS — Z1211 Encounter for screening for malignant neoplasm of colon: Secondary | ICD-10-CM | POA: Diagnosis not present

## 2014-12-02 DIAGNOSIS — Z0000 Encounter for general adult medical examination without abnormal findings: Secondary | ICD-10-CM | POA: Diagnosis not present

## 2014-12-02 DIAGNOSIS — F411 Generalized anxiety disorder: Secondary | ICD-10-CM

## 2014-12-02 MED ORDER — OXYCODONE-ACETAMINOPHEN 5-325 MG PO TABS: 1.0000 | ORAL_TABLET | ORAL | Status: DC | PRN

## 2014-12-02 MED ORDER — CLONAZEPAM 0.5 MG PO TABS: ORAL_TABLET | ORAL | Status: DC

## 2014-12-02 NOTE — Assessment & Plan Note (Signed)
Given handout on supplements to try- tart cherry juice, Omega 3, tumeric. Call or return to clinic prn if these symptoms worsen or fail to improve as anticipated. The patient indicates understanding of these issues and agrees with the plan.

## 2014-12-02 NOTE — Patient Instructions (Addendum)
Good to see you. In order to wean off celexa- try 1 tablet every other day for 2 weeks.  Try the supplements on the hand out I gave you. Let me know if this helps.

## 2014-12-02 NOTE — Progress Notes (Signed)
Subjective:    Patient ID: Jesse Neal, male    DOB: Nov 17, 1965, 49 y.o.   MRN: [id number]  HPI  Very pleasant 49 yo male here for CPX.    Influenza vaccine 08/10/14  Anxiety- on celexa 10 mg daily, as needed clonazepam.  Has panic attacks but since starting celexa, these happen last frequently.  Wants to wean off of celexa because he feels that he has his anxiety under better control.  IBS- had a neg colonoscopy in 2006.   Paroxysmal SVT- followed by Dr. Gigi Gin.  Has had no issues- takes as needed metoprolol.  OA- having more "aches and pains."  Does not want to take an NSAID regularly.  Wants to know what he can try.  Lab Results  Component Value Date   WBC 5.9 11/27/2014   HGB 15.8 11/27/2014   HCT 45.8 11/27/2014   MCV 87.3 11/27/2014   PLT 209.0 11/27/2014   Lab Results  Component Value Date   CHOL 191 11/27/2014   HDL 39.40 11/27/2014   LDLCALC 132* 11/27/2014   TRIG 98.0 11/27/2014   CHOLHDL 5 11/27/2014   Lab Results  Component Value Date   PSA 0.61 11/27/2014   PSA 0.55 09/24/2013   PSA 0.56 05/26/2013   Lab Results  Component Value Date   CREATININE 1.07 11/27/2014   Lab Results  Component Value Date   ALT 18 11/27/2014   AST 17 11/27/2014   ALKPHOS 59 11/27/2014   BILITOT 0.6 11/27/2014     Patient Active Problem List   Diagnosis Date Noted  . Visit for well man health check 12/02/2014  . Back pain 09/24/2013  . Fatigue 09/24/2013  . Polyarthralgia 09/24/2013  . Low testosterone 05/26/2013  . Allergic rhinitis 05/20/2013  . Generalized anxiety disorder 03/03/2013  . Paroxysmal SVT (supraventricular tachycardia) 03/03/2013   Past Medical History  Diagnosis Date  . Anxiety   . SVT (supraventricular tachycardia)     Dr. Gigi Gin   Past Surgical History  Procedure Laterality Date  . Tonsillectomy    . Back surgery  12/2004   History  Substance Use Topics  . Smoking status: Never Smoker   . Smokeless tobacco: Never Used  .  Alcohol Use: Yes     Comment: rare   Family History  Problem Relation Age of Onset  . Cancer Father 63    Prostate  . Hyperlipidemia Father    No Known Allergies Current Outpatient Prescriptions on File Prior to Visit  Medication Sig Dispense Refill  . citalopram (CELEXA) 10 MG tablet Take 1 tablet (10 mg total) by mouth daily. 90 tablet 3  . clonazePAM (KLONOPIN) 0.5 MG tablet Take one half to one tablet by mouth daily 30 tablet 0  . oxyCODONE-acetaminophen (ROXICET) 5-325 MG per tablet Take 1-2 tablets by mouth every 4 (four) hours as needed for severe pain. 25 tablet 0   No current facility-administered medications on file prior to visit.   The PMH, PSH, Social History, Family History, Medications, and allergies have been reviewed in [facility], and have been updated if relevant.   Review of Systems  Constitutional: Negative.   HENT: Negative.   Eyes: Negative.   Respiratory: Negative.   Cardiovascular: Negative.   Gastrointestinal: Negative.   Endocrine: Negative.   Genitourinary: Negative.   Musculoskeletal: Positive for arthralgias.  Skin: Negative.   Allergic/Immunologic: Negative.   Neurological: Negative.   Hematological: Negative.   Psychiatric/Behavioral: Negative.   All other systems reviewed and are negative.  See HPI  Objective:   Physical Exam BP 106/78 mmHg  Pulse 84  Temp(Src) 97.7 F (36.5 C) (Oral)  Resp 16  Wt 189 lb 12.8 oz (86.093 kg)  SpO2 97%  Wt Readings from Last 3 Encounters:  12/02/14 189 lb 12.8 oz (86.093 kg)  08/10/14 193 lb (87.544 kg)  05/21/14 188 lb (85.276 kg)    General:  Pleasant male in NAD Eyes:  PERRL Ears:  External ear exam shows no significant lesions or deformities.  Otoscopic examination reveals clear canals, tympanic membranes are intact bilaterally without bulging, retraction, inflammation or discharge. Hearing is grossly normal bilaterally. Nose:  External nasal examination shows no deformity or inflammation.  Nasal mucosa are pink and moist without lesions or exudates. Mouth:  Oral mucosa and oropharynx without lesions or exudates.  Teeth in good repair. Neck:  no carotid bruit or thyromegaly no cervical or supraclavicular lymphadenopathy  Lungs:  Normal respiratory effort, chest expands symmetrically. Lungs are clear to auscultation, no crackles or wheezes. Heart:  Normal rate and regular rhythm. S1 and S2 normal without gallop, murmur, click, rub or other extra sounds. Abdomen:  Bowel sounds positive,abdomen soft and non-tender without masses, organomegaly or hernias noted. Pulses:  R and L posterior tibial pulses are full and equal bilaterally  Extremities:  no edema     Assessment & Plan:

## 2014-12-02 NOTE — Assessment & Plan Note (Signed)
Improved.  Wants to wean off celexa- gave instructions on how to do this. See AVS.

## 2014-12-02 NOTE — Assessment & Plan Note (Signed)
Reviewed preventive care protocols, scheduled due services, and updated immunizations Discussed nutrition, exercise, diet, and healthy lifestyle.  

## 2014-12-03 MED ORDER — CLONAZEPAM 0.5 MG PO TABS: ORAL_TABLET | ORAL | Status: DC

## 2014-12-03 MED ORDER — OXYCODONE-ACETAMINOPHEN 5-325 MG PO TABS: 1.0000 | ORAL_TABLET | ORAL | Status: DC | PRN

## 2014-12-03 NOTE — Addendum Note (Signed)
Addended by: Modena Nunnery on: 12/03/2014 11:22 AM   Modules accepted: Orders

## 2014-12-03 NOTE — Telephone Encounter (Signed)
Spoke to pt and informed him Rx is available for pickup from the front desk 

## 2014-12-09 ENCOUNTER — Encounter: Payer: Self-pay | Admitting: Family Medicine

## 2014-12-14 ENCOUNTER — Other Ambulatory Visit: Payer: Self-pay | Admitting: Family Medicine

## 2015-01-20 ENCOUNTER — Encounter: Payer: Self-pay | Admitting: Internal Medicine

## 2015-03-31 ENCOUNTER — Encounter: Payer: Self-pay | Admitting: Family Medicine

## 2015-03-31 ENCOUNTER — Other Ambulatory Visit: Payer: Self-pay | Admitting: Family Medicine

## 2015-03-31 DIAGNOSIS — K649 Unspecified hemorrhoids: Secondary | ICD-10-CM

## 2015-04-07 ENCOUNTER — Encounter: Payer: Self-pay | Admitting: General Surgery

## 2015-04-07 ENCOUNTER — Ambulatory Visit: Payer: Self-pay | Admitting: General Surgery

## 2015-04-07 ENCOUNTER — Ambulatory Visit (INDEPENDENT_AMBULATORY_CARE_PROVIDER_SITE_OTHER): Payer: BLUE CROSS/BLUE SHIELD | Admitting: General Surgery

## 2015-04-07 VITALS — BP 140/72 | HR 78 | Resp 12 | Ht 69.0 in | Wt 183.0 lb

## 2015-04-07 DIAGNOSIS — K625 Hemorrhage of anus and rectum: Secondary | ICD-10-CM

## 2015-04-07 DIAGNOSIS — K648 Other hemorrhoids: Secondary | ICD-10-CM | POA: Diagnosis not present

## 2015-04-07 LAB — POC HEMOCCULT BLD/STL (OFFICE/1-CARD/DIAGNOSTIC): Fecal Occult Blood, POC: NEGATIVE

## 2015-04-07 NOTE — Progress Notes (Signed)
Patient ID: Jesse Neal, male   DOB: 15-Dec-1965, 49 y.o.   MRN: [id number]  Chief Complaint  Patient presents with  . Other    hemorrhoids    HPI Jesse Neal is a 49 y.o. male here today for a evaluation of hemorrhoids. Patient states he has had these for about five years. He states he has had off and on red blood on the paper and in the bowel. Patient states there is some itching. He has tryed stooll softers and creams. He moves his bowels daily.  Bleeding is fairly uncommon. He does not make use of soap for perianal cleansing.  HPI  Past Medical History  Diagnosis Date  . Anxiety   . SVT (supraventricular tachycardia)     Dr. Gigi Gin  . GERD (gastroesophageal reflux disease)     Past Surgical History  Procedure Laterality Date  . Tonsillectomy    . Back surgery  12/2004  . Colonoscopy  2006    Family History  Problem Relation Age of Onset  . Cancer Father 32    Prostate  . Hyperlipidemia Father     Social History History  Substance Use Topics  . Smoking status: Never Smoker   . Smokeless tobacco: Never Used  . Alcohol Use: Yes     Comment: rare    No Known Allergies  Current Outpatient Prescriptions  Medication Sig Dispense Refill  . acetaminophen (TYLENOL) 325 MG tablet Take 650 mg by mouth every 6 (six) hours as needed.    Marland Kitchen HYDROcodone-acetaminophen (NORCO/VICODIN) 5-325 MG per tablet Take 1 tablet by mouth as needed for moderate pain.     No current facility-administered medications for this visit.    Review of Systems Review of Systems  Constitutional: Negative.   Respiratory: Negative.   Cardiovascular: Negative.     Blood pressure 140/72, pulse 78, resp. rate 12, height 5\' 9"  (1.753 m), weight 183 lb (83.008 kg).  Physical Exam Physical Exam  Constitutional: He is oriented to person, place, and time. He appears well-developed and well-nourished.  Eyes: Conjunctivae are normal. No scleral icterus.  Neck: Neck supple.   Cardiovascular: Normal rate, regular rhythm and normal heart sounds.   Pulmonary/Chest: Effort normal and breath sounds normal.  Genitourinary: Prostate normal. Rectal exam shows internal hemorrhoid ( one small hemorrhoid,very minimal). Rectal exam shows no mass and anal tone normal. Guaiac negative stool.  Lymphadenopathy:    He has no cervical adenopathy.  Neurological: He is alert and oriented to person, place, and time.  Skin: Skin is warm and dry.    Data Reviewed None  Assessment    Small internal hemorrhoid, unlikely source of perianal discomfort.    Plan    The patient did have fairly firm stool in the rectal vault. He may benefit from stool softeners and fiber supplements. He is certainly a candidate for a colonoscopy. His last exam was 10 years ago with the [facility]. A follow-up exam was encouraged. The patient reports she's got to put this off until 2017. He'll notify the office a week and be of any assistance.   The patient will be encouraged to make use of a daily fiber supplement. Patient to return as needed.   PCP:  Geoffery Lyons 04/08/2015, 7:16 PM

## 2015-04-07 NOTE — Patient Instructions (Signed)
The patient will be encouraged to make use of a daily fiber supplement.

## 2015-04-08 DIAGNOSIS — K625 Hemorrhage of anus and rectum: Secondary | ICD-10-CM | POA: Insufficient documentation

## 2015-04-08 DIAGNOSIS — K648 Other hemorrhoids: Secondary | ICD-10-CM | POA: Insufficient documentation

## 2015-08-03 ENCOUNTER — Encounter: Payer: Self-pay | Admitting: Family Medicine

## 2015-08-03 ENCOUNTER — Ambulatory Visit (INDEPENDENT_AMBULATORY_CARE_PROVIDER_SITE_OTHER): Payer: BLUE CROSS/BLUE SHIELD | Admitting: Family Medicine

## 2015-08-03 VITALS — BP 110/66 | HR 64 | Temp 97.7°F | Wt 175.8 lb

## 2015-08-03 DIAGNOSIS — K610 Anal abscess: Secondary | ICD-10-CM | POA: Diagnosis not present

## 2015-08-03 DIAGNOSIS — K648 Other hemorrhoids: Secondary | ICD-10-CM | POA: Diagnosis not present

## 2015-08-03 DIAGNOSIS — K649 Unspecified hemorrhoids: Secondary | ICD-10-CM | POA: Insufficient documentation

## 2015-08-03 DIAGNOSIS — M25521 Pain in right elbow: Secondary | ICD-10-CM | POA: Diagnosis not present

## 2015-08-03 MED ORDER — SULFAMETHOXAZOLE-TRIMETHOPRIM 800-160 MG PO TABS: 1.0000 | ORAL_TABLET | Freq: Two times a day (BID) | ORAL | Status: DC

## 2015-08-03 NOTE — Progress Notes (Signed)
Subjective:   Patient ID: Jesse Neal, male    DOB: 28-Dec-1965, 49 y.o.   MRN: [id number]  Jesse Neal is a pleasant 49 y.o. 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  Has not tried anything for it.  No tingling in hands.  No decreased grip strength.  ? Hemorrhoids- past week or two, very painful area around his rectum-? Hemorrhoid.  Has had hemorrhoids in past but never this painful. Not itching or bleeding but "Very tender." No fevers.  Current Outpatient Prescriptions on File Prior to Visit  Medication Sig Dispense Refill  . acetaminophen (TYLENOL) 325 MG tablet Take 650 mg by mouth every 6 (six) hours as needed.    Marland Kitchen HYDROcodone-acetaminophen (NORCO/VICODIN) 5-325 MG per tablet Take 1 tablet by mouth as needed for moderate pain.     No current facility-administered medications on file prior to visit.    No Known Allergies  Past Medical History  Diagnosis Date  . Anxiety   . SVT (supraventricular tachycardia) (HCC)     Dr. Gigi Gin  . GERD (gastroesophageal reflux disease)     Past Surgical History  Procedure Laterality Date  . Tonsillectomy    . Back surgery  12/2004  . Colonoscopy  2006    Family History  Problem Relation Age of Onset  . Cancer Father 28    Prostate  . Hyperlipidemia Father     Social History   Social History  . Marital Status: Married    Spouse Name: N/A  . Number of Children: N/A  . Years of Education: N/A   Occupational History  . Not on file.   Social History Main Topics  . Smoking status: Never Smoker   . Smokeless tobacco: Never Used  . Alcohol Use: Yes     Comment: rare  . Drug Use: No  . Sexual Activity: Not on file   Other Topics Concern  . 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   The PMH, PSH, Social History, Family History, Medications, and allergies have been reviewed in [facility], and have been updated if relevant.   Review of Systems  Constitutional: Negative.   Gastrointestinal: Positive for rectal pain. Negative for nausea, vomiting, abdominal pain, diarrhea, constipation, blood in stool, abdominal distention and anal bleeding.  Genitourinary: Negative.   Musculoskeletal: Positive for myalgias.  Skin: Negative.   Neurological: Negative.   Psychiatric/Behavioral: Negative.   All other systems reviewed and are negative.      Objective:    BP 110/66 mmHg  Pulse 64  Temp(Src) 97.7 F (36.5 C) (Oral)  Wt 175 lb 12 oz (79.72 kg)  SpO2 98%   Physical Exam  Constitutional: He is oriented to person, place, and time. He appears well-developed and well-nourished. No distress.  HENT:  Head: Normocephalic.  Eyes: Conjunctivae are normal.  Cardiovascular: Normal rate.   Pulmonary/Chest: Effort normal.  Genitourinary:     Musculoskeletal:  Normal grip strength bilterally TTP over latera epicondyle head, pain elicited with supination  Neurological: He is alert and oriented to person, place, and time. No cranial nerve deficit.  Nursing note and vitals reviewed.         Assessment & Plan:  Right elbow pain  Other hemorrhoids  Perianal abscess No Follow-up on file.

## 2015-08-03 NOTE — Assessment & Plan Note (Signed)
New- appears to be draining. Place on Bactrim, refer to Gen Surgery- explained that these of need I and D and unclear how deep abscess is although it is draining. The patient indicates understanding of these issues and agrees with the plan.

## 2015-08-03 NOTE — Progress Notes (Signed)
Pre visit review using our clinic review tool, if applicable. No additional management support is needed unless otherwise documented below in the visit note. 

## 2015-08-03 NOTE — Assessment & Plan Note (Signed)
New- consistent with lateral epicondylitis. Declines PT. Handout given from sports med advisor discussing supportive care- NSAIDs, exercises, advised OTC elbow strap. Call or return to clinic prn if these symptoms worsen or fail to improve as anticipated. The patient indicates understanding of these issues and agrees with the plan.

## 2015-08-03 NOTE — Patient Instructions (Signed)
Please stop by to see Jesse Neal on your way out.

## 2015-08-04 ENCOUNTER — Encounter: Payer: Self-pay | Admitting: Family Medicine

## 2015-08-04 ENCOUNTER — Other Ambulatory Visit: Payer: Self-pay | Admitting: Family Medicine

## 2015-08-04 MED ORDER — CLINDAMYCIN HCL 300 MG PO CAPS: 300.0000 mg | ORAL_CAPSULE | Freq: Three times a day (TID) | ORAL | Status: DC

## 2015-08-09 ENCOUNTER — Encounter: Payer: Self-pay | Admitting: Family Medicine

## 2015-08-19 ENCOUNTER — Ambulatory Visit (INDEPENDENT_AMBULATORY_CARE_PROVIDER_SITE_OTHER): Payer: BLUE CROSS/BLUE SHIELD | Admitting: General Surgery

## 2015-08-19 ENCOUNTER — Encounter: Payer: Self-pay | Admitting: General Surgery

## 2015-08-19 VITALS — BP 132/84 | HR 60 | Resp 12 | Ht 68.0 in | Wt 174.0 lb

## 2015-08-19 DIAGNOSIS — K6289 Other specified diseases of anus and rectum: Secondary | ICD-10-CM | POA: Diagnosis not present

## 2015-08-19 NOTE — Progress Notes (Signed)
Patient ID: Jesse Neal, male   DOB: 05-13-1966, 49 y.o.   MRN: [id number]  Chief Complaint  Patient presents with  . Abscess    HPI Jesse Neal is a 49 y.o. male.  Here today for evaluation of a possible perirectal abscess. He states he started having problems again mid November, swelling, pain and drainage in the area anterior and to the right of the midline from the anus. He just finished a round of Clindamycin on Saturday. He notices significant resolution of the swelling. It had gotten better but yesterday seemed to be worse again in regards to discomfort with sitting. He states it feels like he is sitting on "bricks" and it stings.  He feels like in February he had a buttock pimple that seemed to give him a little trouble. He described it as a hemorrhoid and it was tender and uncomfortable.  He was seen in July with hemorrhoid bleeding. He states he had increased urination for a month after his last rectal exam. Increased frequency and volume. No reported decrease in urinary stream.  HPI  Past Medical History  Diagnosis Date  . Anxiety   . SVT (supraventricular tachycardia) (HCC)     Dr. Gigi Gin  . GERD (gastroesophageal reflux disease)     Past Surgical History  Procedure Laterality Date  . Tonsillectomy    . Back surgery  12/2004  . Colonoscopy  2006    Family History  Problem Relation Age of Onset  . Cancer Father 65    Prostate  . Hyperlipidemia Father     Social History Social History  Substance Use Topics  . Smoking status: Never Smoker   . Smokeless tobacco: Never Used  . Alcohol Use: Yes     Comment: rare    Allergies  Allergen Reactions  . Bactrim [Sulfamethoxazole-Trimethoprim] Rash    Current Outpatient Prescriptions  Medication Sig Dispense Refill  . acetaminophen (TYLENOL) 325 MG tablet Take 650 mg by mouth every 6 (six) hours as needed.     No current facility-administered medications for this visit.    Review of Systems Review  of Systems  Constitutional: Negative.   Respiratory: Negative.   Cardiovascular: Negative.     Blood pressure 132/84, pulse 60, resp. rate 12, height 5\' 8"  (1.727 m), weight 174 lb (78.926 kg).  Physical Exam Physical Exam  Constitutional: He is oriented to person, place, and time. He appears well-developed and well-nourished.  HENT:  Mouth/Throat: Oropharynx is clear and moist.  Eyes: Conjunctivae are normal. No scleral icterus.  Neck: Neck supple.  Cardiovascular: Normal rate, regular rhythm and normal heart sounds.   Pulmonary/Chest: Effort normal and breath sounds normal.  Abdominal: Soft.  Genitourinary: Prostate normal. Rectal exam shows no mass and anal tone normal.     No erythema, induration or swelling of the perineum. Digital rectal exam shows the levator muscles to be unremarkable. Prostate was small and normal. No bogginess to suggest chronic prostatitis. Normal median raphe.  Lymphadenopathy:    He has no cervical adenopathy.       Right: No inguinal adenopathy present.       Left: No inguinal adenopathy present.  Neurological: He is alert and oriented to person, place, and time.  Skin: Skin is warm and dry.  Psychiatric: His behavior is normal.    Data Reviewed PCP notes.  Assessment    Perirectal pain, unclear etiology.    Plan    The patient has been asked to call the office directly  should he have recurrent episode of pain and or swelling. At this point, I think an MRI is unlikely to yield any useful information. With a normal prostate exam and return to normal urination I don't think that urology assessment at this time will be of benefit, but is to be considered in the future if recurrent symptoms without clinical evidence of a anorectal source is identified.    Apply triple antibiotic ointment to the area. Call office if symptoms flare up again.   Plan colonoscopy for 2017  PCP:  Kyra Leyland M 08/19/2015, 9:26 AM

## 2015-08-19 NOTE — Patient Instructions (Addendum)
The patient is aware to call back for any questions or concerns. Apply triple antibiotic ointment to the area. Call office if symptoms flare up again.

## 2015-08-20 DIAGNOSIS — K6289 Other specified diseases of anus and rectum: Secondary | ICD-10-CM | POA: Insufficient documentation

## 2016-01-17 ENCOUNTER — Other Ambulatory Visit: Payer: Self-pay | Admitting: Family Medicine

## 2016-01-17 DIAGNOSIS — Z0000 Encounter for general adult medical examination without abnormal findings: Secondary | ICD-10-CM

## 2016-01-24 ENCOUNTER — Other Ambulatory Visit (INDEPENDENT_AMBULATORY_CARE_PROVIDER_SITE_OTHER): Payer: BLUE CROSS/BLUE SHIELD

## 2016-01-24 DIAGNOSIS — R7989 Other specified abnormal findings of blood chemistry: Secondary | ICD-10-CM | POA: Diagnosis not present

## 2016-01-24 DIAGNOSIS — Z0000 Encounter for general adult medical examination without abnormal findings: Secondary | ICD-10-CM

## 2016-01-25 LAB — COMPREHENSIVE METABOLIC PANEL
ALT: 22 U/L (ref 0–53)
AST: 17 U/L (ref 0–37)
Albumin: 4.8 g/dL (ref 3.5–5.2)
Alkaline Phosphatase: 59 U/L (ref 39–117)
BUN: 12 mg/dL (ref 6–23)
CO2: 27 mEq/L (ref 19–32)
Calcium: 9.6 mg/dL (ref 8.4–10.5)
Chloride: 104 mEq/L (ref 96–112)
Creatinine, Ser: 0.91 mg/dL (ref 0.40–1.50)
GFR: 93.19 mL/min (ref >60.00)
Glucose, Bld: 86 mg/dL (ref 70–99)
Potassium: 4.3 mEq/L (ref 3.5–5.1)
Sodium: 139 mEq/L (ref 135–145)
Total Bilirubin: 0.5 mg/dL (ref 0.2–1.2)
Total Protein: 6.9 g/dL (ref 6.0–8.3)

## 2016-01-25 LAB — CBC WITH DIFFERENTIAL/PLATELET
Basophils Absolute: 0.1 10*3/uL (ref 0.0–0.1)
Basophils Relative: 0.9 % (ref 0.0–3.0)
Eosinophils Absolute: 0.1 10*3/uL (ref 0.0–0.7)
Eosinophils Relative: 1.8 % (ref 0.0–5.0)
HCT: 45.2 % (ref 39.0–52.0)
Hemoglobin: 15.4 g/dL (ref 13.0–17.0)
Lymphocytes Relative: 38.9 % (ref 12.0–46.0)
Lymphs Abs: 2.6 10*3/uL (ref 0.7–4.0)
MCHC: 34 g/dL (ref 30.0–36.0)
MCV: 88.8 fl (ref 78.0–100.0)
Monocytes Absolute: 0.4 10*3/uL (ref 0.1–1.0)
Monocytes Relative: 5.4 % (ref 3.0–12.0)
Neutro Abs: 3.6 10*3/uL (ref 1.4–7.7)
Neutrophils Relative %: 53 % (ref 43.0–77.0)
Platelets: 220 10*3/uL (ref 150.0–400.0)
RBC: 5.09 Mil/uL (ref 4.22–5.81)
RDW: 12.8 % (ref 11.5–15.5)
WBC: 6.7 10*3/uL (ref 4.0–10.5)

## 2016-01-25 LAB — LIPID PANEL
Cholesterol: 172 mg/dL (ref 0–200)
HDL: 38.7 mg/dL — ABNORMAL LOW (ref >39.00)
NonHDL: 133.63
Total CHOL/HDL Ratio: 4
Triglycerides: 239 mg/dL — ABNORMAL HIGH (ref 0.0–149.0)
VLDL: 47.8 mg/dL — ABNORMAL HIGH (ref 0.0–40.0)

## 2016-01-25 LAB — PSA: PSA: 0.5 ng/mL (ref 0.10–4.00)

## 2016-01-25 LAB — LDL CHOLESTEROL, DIRECT: Direct LDL: 103 mg/dL

## 2016-01-27 ENCOUNTER — Ambulatory Visit (INDEPENDENT_AMBULATORY_CARE_PROVIDER_SITE_OTHER): Payer: BLUE CROSS/BLUE SHIELD | Admitting: Family Medicine

## 2016-01-27 ENCOUNTER — Encounter: Payer: Self-pay | Admitting: Family Medicine

## 2016-01-27 VITALS — BP 120/68 | HR 65 | Temp 97.5°F | Ht 68.0 in | Wt 169.5 lb

## 2016-01-27 DIAGNOSIS — Z1211 Encounter for screening for malignant neoplasm of colon: Secondary | ICD-10-CM

## 2016-01-27 DIAGNOSIS — Z0000 Encounter for general adult medical examination without abnormal findings: Secondary | ICD-10-CM

## 2016-01-27 DIAGNOSIS — F411 Generalized anxiety disorder: Secondary | ICD-10-CM | POA: Diagnosis not present

## 2016-01-27 MED ORDER — CLONAZEPAM 0.5 MG PO TABS: ORAL_TABLET | ORAL | Status: DC

## 2016-01-27 MED ORDER — SCOPOLAMINE 1 MG/3DAYS TD PT72: 1.0000 | MEDICATED_PATCH | TRANSDERMAL | Status: DC

## 2016-01-27 NOTE — Assessment & Plan Note (Signed)
Reviewed preventive care protocols, scheduled due services, and updated immunizations Discussed nutrition, exercise, diet, and healthy lifestyle.  

## 2016-01-27 NOTE — Progress Notes (Signed)
Subjective:    Patient ID: Jesse Neal, male    DOB: 1966/06/15, 50 y.o.   MRN: [id number]  HPI  Very pleasant 50 yo male here for CPX.    Influenza vaccine 9/90/16  IBS- had a neg colonoscopy in 2006.  Willing to have repeat colonoscopy at the end of the summer.  Paroxysmal SVT- followed by Dr. Gigi Gin.  Has had no issues- takes as needed metoprolol but has not needed to take it.  Has intentionally lost a lot of weight.  Changed his diet and is more physically active.  Wt Readings from Last 3 Encounters:  01/27/16 169 lb 8 oz (76.885 kg)  08/19/15 174 lb (78.926 kg)  08/03/15 175 lb 12 oz (79.72 kg)   Unfortunately, mother is now on hospice for lung cancer.  He feels he is coping ok but asks for rx for clonazepam to be refilled in case he needs it.    Lab Results  Component Value Date   WBC 6.7 01/24/2016   HGB 15.4 01/24/2016   HCT 45.2 01/24/2016   MCV 88.8 01/24/2016   PLT 220.0 01/24/2016   Lab Results  Component Value Date   CHOL 172 01/24/2016   HDL 38.70* 01/24/2016   LDLCALC 132* 11/27/2014   LDLDIRECT 103.0 01/24/2016   TRIG 239.0* 01/24/2016   CHOLHDL 4 01/24/2016   Lab Results  Component Value Date   PSA 0.50 01/24/2016   PSA 0.61 11/27/2014   PSA 0.55 09/24/2013   Lab Results  Component Value Date   CREATININE 0.91 01/24/2016   Lab Results  Component Value Date   ALT 22 01/24/2016   AST 17 01/24/2016   ALKPHOS 59 01/24/2016   BILITOT 0.5 01/24/2016     Patient Active Problem List   Diagnosis Date Noted  . Visit for well man health check 01/17/2016  . Hemorrhoid 08/03/2015  . OA (osteoarthritis) 12/02/2014  . Low testosterone 05/26/2013  . Allergic rhinitis 05/20/2013  . Generalized anxiety disorder 03/03/2013  . Paroxysmal SVT (supraventricular tachycardia) ([place]) 03/03/2013   Past Medical History  Diagnosis Date  . Anxiety   . SVT (supraventricular tachycardia) (HCC)     Dr. Gigi Gin  . GERD (gastroesophageal reflux  disease)    Past Surgical History  Procedure Laterality Date  . Tonsillectomy    . Back surgery  12/2004  . Colonoscopy  2006   Social History  Substance Use Topics  . Smoking status: Never Smoker   . Smokeless tobacco: Never Used  . Alcohol Use: Yes     Comment: rare   Family History  Problem Relation Age of Onset  . Cancer Father 49    Prostate  . Hyperlipidemia Father    Allergies  Allergen Reactions  . Bactrim [Sulfamethoxazole-Trimethoprim] Rash   Current Outpatient Prescriptions on File Prior to Visit  Medication Sig Dispense Refill  . acetaminophen (TYLENOL) 325 MG tablet Take 650 mg by mouth every 6 (six) hours as needed.     No current facility-administered medications on file prior to visit.   The PMH, PSH, Social History, Family History, Medications, and allergies have been reviewed in [facility], and have been updated if relevant.   Review of Systems  Constitutional: Negative.   HENT: Negative.   Eyes: Negative.   Respiratory: Negative.   Cardiovascular: Negative.   Gastrointestinal: Negative.   Endocrine: Negative.   Genitourinary: Negative.   Musculoskeletal: Positive for arthralgias.  Skin: Negative.   Allergic/Immunologic: Negative.   Neurological: Negative.   Hematological: Negative.  Psychiatric/Behavioral: The patient is nervous/anxious.   All other systems reviewed and are negative.     See HPI  Objective:   Physical Exam BP 120/68 mmHg  Pulse 65  Temp(Src) 97.5 F (36.4 C) (Oral)  Ht 5\' 8"  (1.727 m)  Wt 169 lb 8 oz (76.885 kg)  BMI 25.78 kg/m2  SpO2 98%  Wt Readings from Last 3 Encounters:  01/27/16 169 lb 8 oz (76.885 kg)  08/19/15 174 lb (78.926 kg)  08/03/15 175 lb 12 oz (79.72 kg)    General:  Pleasant male in NAD Eyes:  PERRL Ears:  External ear exam shows no significant lesions or deformities.  Otoscopic examination reveals clear canals, tympanic membranes are intact bilaterally without bulging, retraction, inflammation or  discharge. Hearing is grossly normal bilaterally. Nose:  External nasal examination shows no deformity or inflammation. Nasal mucosa are pink and moist without lesions or exudates. Mouth:  Oral mucosa and oropharynx without lesions or exudates.  Teeth in good repair. Neck:  no carotid bruit or thyromegaly no cervical or supraclavicular lymphadenopathy  Lungs:  Normal respiratory effort, chest expands symmetrically. Lungs are clear to auscultation, no crackles or wheezes. Heart:  Normal rate and regular rhythm. S1 and S2 normal without gallop, murmur, click, rub or other extra sounds. Abdomen:  Bowel sounds positive,abdomen soft and non-tender without masses, organomegaly or hernias noted. Pulses:  R and L posterior tibial pulses are full and equal bilaterally  Extremities:  no edema     Assessment & Plan:

## 2016-01-27 NOTE — Patient Instructions (Signed)
Great to see you. I am sorry you are going through a tough time.  Dr. Zigmund Daniel is a urogynecologist at [facility].

## 2016-01-27 NOTE — Progress Notes (Signed)
Pre visit review using our clinic review tool, if applicable. No additional management support is needed unless otherwise documented below in the visit note. 

## 2016-01-27 NOTE — Assessment & Plan Note (Signed)
Well controlled even with recent stressors. Rx for clonazepam refilled and given to pt. Call or return to clinic prn if these symptoms worsen or fail to improve as anticipated. The patient indicates understanding of these issues and agrees with the plan.

## 2016-02-28 ENCOUNTER — Encounter: Payer: Self-pay | Admitting: Family Medicine

## 2016-04-27 ENCOUNTER — Encounter: Payer: Self-pay | Admitting: Internal Medicine

## 2016-07-06 ENCOUNTER — Ambulatory Visit (AMBULATORY_SURGERY_CENTER): Payer: Self-pay

## 2016-07-06 VITALS — Ht 68.5 in | Wt 173.8 lb

## 2016-07-06 DIAGNOSIS — Z1211 Encounter for screening for malignant neoplasm of colon: Secondary | ICD-10-CM

## 2016-07-06 NOTE — Progress Notes (Signed)
No allergies to eggs or soy No past problems with anesthesia No diet meds No home oxygen  Declined emmi 

## 2016-07-07 ENCOUNTER — Encounter: Payer: Self-pay | Admitting: Internal Medicine

## 2016-07-13 ENCOUNTER — Encounter: Payer: Self-pay | Admitting: Internal Medicine

## 2016-07-21 ENCOUNTER — Encounter: Payer: BLUE CROSS/BLUE SHIELD | Admitting: Internal Medicine

## 2016-12-07 ENCOUNTER — Ambulatory Visit (INDEPENDENT_AMBULATORY_CARE_PROVIDER_SITE_OTHER): Payer: BLUE CROSS/BLUE SHIELD | Admitting: Family Medicine

## 2016-12-07 ENCOUNTER — Encounter: Payer: Self-pay | Admitting: Family Medicine

## 2016-12-07 VITALS — BP 100/70 | HR 71 | Temp 98.3°F | Ht 68.0 in | Wt 180.2 lb

## 2016-12-07 DIAGNOSIS — M5417 Radiculopathy, lumbosacral region: Secondary | ICD-10-CM | POA: Diagnosis not present

## 2016-12-07 DIAGNOSIS — M5416 Radiculopathy, lumbar region: Secondary | ICD-10-CM

## 2016-12-07 MED ORDER — PREDNISONE 20 MG PO TABS: ORAL_TABLET | ORAL | 0 refills | Status: DC

## 2016-12-07 NOTE — Progress Notes (Signed)
Pre visit review using our clinic review tool, if applicable. No additional management support is needed unless otherwise documented below in the visit note. 

## 2016-12-07 NOTE — Patient Instructions (Addendum)
Tart cherry juice or tart cherry juice extract  Hyaluronic acid.

## 2016-12-07 NOTE — Progress Notes (Signed)
Dr. Frederico Hamman T. Zaim Nitta, MD, [facility] [place], [address] Phone: [phone] Fax: [phone]  12/07/2016  Patient: Jesse Neal, MRN: [id number], DOB: 25-Sep-1965, 51 y.o.  Primary Physician:  Arnette Norris, MD   Chief Complaint  Patient presents with  . Back Pain    left lower near hip and radiates down leg   Subjective:   Jesse Neal is a 51 y.o. very pleasant male patient who presents with the following: Back Pain  ongoing for approximately: 9 d The patient has had back pain before. The back pain is localized into the lumbar spine area. They also describe L sided radiculopathy.  LBP with radiculopathy on the L.   h/o L4-5 back surgery. Discectomy.  Hurt his back last Tuesday, over on the outside and around the corner.  Has pain going down the L leg and to the ankle.   Using muscle relaxers, heat, ice.  Took a day off of work.  Flexeril.   Lateral bending is bothering him a lot.   No numbness or tingling. No bowel or bladder incontinence. No focal weakness. Prior interventions: distant surgery Physical therapy: none recent Chiropractic manipulations: No Acupuncture: No Osteopathic manipulation: No Heat or cold: Minimal effect  Past Medical History, Surgical History, Family History, Medications, Allergies have been reviewed and updated if relevant.  Patient Active Problem List   Diagnosis Date Noted  . Visit for well man health check 01/17/2016  . Hemorrhoid 08/03/2015  . OA (osteoarthritis) 12/02/2014  . Low testosterone 05/26/2013  . Allergic rhinitis 05/20/2013  . Generalized anxiety disorder 03/03/2013  . Paroxysmal SVT (supraventricular tachycardia) ([place]) 03/03/2013    Past Medical History:  Diagnosis Date  . Anxiety   . GERD (gastroesophageal reflux disease)   . SVT (supraventricular tachycardia) (HCC)    Dr. Gigi Gin    Past Surgical History:  Procedure Laterality Date  .  COLONOSCOPY  2006  . LUMBAR DISC SURGERY  12/2004   L4-5  . TONSILLECTOMY      Social History   Social History  . Marital status: Married    Spouse name: N/A  . Number of children: N/A  . Years of education: N/A   Occupational History  . Not on file.   Social History Main Topics  . Smoking status: Never Smoker  . Smokeless tobacco: Never Used  . Alcohol use Yes     Comment: once monthly  . Drug use: No  . Sexual activity: Not on file   Other Topics Concern  . 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    Family History  Problem Relation Age of Onset  . Cancer Father 53    Prostate  . Hyperlipidemia Father   . Colon cancer Neg Hx     Allergies  Allergen Reactions  . Bactrim [Sulfamethoxazole-Trimethoprim] Rash    Medication list reviewed and updated in full in [facility].  GEN: No fevers, chills. Nontoxic. Primarily MSK c/o today. MSK: Detailed in the HPI GI: tolerating PO intake without difficulty Neuro: As above  Otherwise the pertinent positives of the ROS are noted above.    Objective:   Blood pressure 100/70, pulse 71, temperature 98.3 F (36.8 C), temperature source Oral, height 5\' 8"  (6.720 m), weight 180 lb 4 oz (81.8 kg).  Gen: Well-developed,well-nourished,in no acute distress; alert,appropriate and cooperative throughout examination HEENT: Normocephalic and  atraumatic without obvious abnormalities.  Ears, externally no deformities Pulm: Breathing comfortably in no respiratory distress Range of motion at  the waist: Flexion, rotation and lateral bending: normal flexion and ext with limited lateral bending.  No echymosis or edema Rises to examination table with no difficulty Gait: minimally antalgic  Inspection/Deformity: No abnormality Paraspinus T:  Mild-mod ttp l2-s1  B Ankle Dorsiflexion (L5,4): 5/5 B Great Toe Dorsiflexion (L5,4): 5/5 Heel Walk (L5): WNL Toe Walk  (S1): WNL Rise/Squat (L4): WNL, mild pain  SENSORY B Medial Foot (L4): WNL B Dorsum (L5): WNL B Lateral (S1): WNL Light Touch: WNL Pinprick: WNL  REFLEXES Knee (L4): 2+ Ankle (S1): 2+  B SLR, seated: neg B SLR, supine: neg B FABER: neg B Reverse FABER: neg B Greater Troch: NT B Log Roll: neg B Stork: NT B Sciatic Notch: NT  Radiology: No results found.  Assessment and Plan:   Lumbar back pain with radiculopathy affecting left lower extremity  Anatomy reviewed. Conservative algorithms for acute back pain generally begin with the following: NSAIDS, Muscle Relaxants, Mild pain medication  Start with medications, core rehab, and progress from there following low back pain algorithm. No red flags are present. Steroids in this case - likely discogenic vs other nerve involvement  Follow-up: if needed  Meds ordered this encounter  Medications  . predniSONE (DELTASONE) 20 MG tablet    Sig: 2 tabs po for 5 days, then 1 tab for 5 days    Dispense:  15 tablet    Refill:  0   Medications Discontinued During This Encounter  Medication Reason  . scopolamine (TRANSDERM-SCOP, 1.5 MG,) 1 MG/3DAYS Completed Course   Signed,  Kyrstyn Greear T. Bhargav Barbaro, MD   Allergies as of 12/07/2016      Reactions   Bactrim [sulfamethoxazole-trimethoprim] Rash      Medication List       Accurate as of 12/07/16 11:59 PM. Always use your most recent med list.          acetaminophen 325 MG tablet Commonly known as:  TYLENOL Take 650 mg by mouth every 6 (six) hours as needed.   clonazePAM 0.5 MG tablet Commonly known as:  KLONOPIN Take one half to one tablet by mouth daily   predniSONE 20 MG tablet Commonly known as:  DELTASONE 2 tabs po for 5 days, then 1 tab for 5 days

## 2016-12-08 ENCOUNTER — Encounter: Payer: Self-pay | Admitting: Family Medicine

## 2017-01-23 ENCOUNTER — Other Ambulatory Visit: Payer: Self-pay | Admitting: Family Medicine

## 2017-01-23 DIAGNOSIS — Z0000 Encounter for general adult medical examination without abnormal findings: Secondary | ICD-10-CM

## 2017-01-30 ENCOUNTER — Other Ambulatory Visit: Payer: Self-pay | Admitting: Family Medicine

## 2017-01-30 DIAGNOSIS — R7989 Other specified abnormal findings of blood chemistry: Secondary | ICD-10-CM

## 2017-01-31 ENCOUNTER — Other Ambulatory Visit (INDEPENDENT_AMBULATORY_CARE_PROVIDER_SITE_OTHER): Payer: BLUE CROSS/BLUE SHIELD

## 2017-01-31 DIAGNOSIS — R7989 Other specified abnormal findings of blood chemistry: Secondary | ICD-10-CM

## 2017-01-31 DIAGNOSIS — Z0000 Encounter for general adult medical examination without abnormal findings: Secondary | ICD-10-CM

## 2017-01-31 LAB — COMPREHENSIVE METABOLIC PANEL
ALT: 17 U/L (ref 0–53)
AST: 14 U/L (ref 0–37)
Albumin: 4.7 g/dL (ref 3.5–5.2)
Alkaline Phosphatase: 54 U/L (ref 39–117)
BUN: 15 mg/dL (ref 6–23)
CO2: 30 mEq/L (ref 19–32)
Calcium: 9.4 mg/dL (ref 8.4–10.5)
Chloride: 107 mEq/L (ref 96–112)
Creatinine, Ser: 1.01 mg/dL (ref 0.40–1.50)
GFR: 82.3 mL/min (ref >60.00)
Glucose, Bld: 102 mg/dL — ABNORMAL HIGH (ref 70–99)
Potassium: 4.5 mEq/L (ref 3.5–5.1)
Sodium: 142 mEq/L (ref 135–145)
Total Bilirubin: 0.6 mg/dL (ref 0.2–1.2)
Total Protein: 6.5 g/dL (ref 6.0–8.3)

## 2017-01-31 LAB — CBC WITH DIFFERENTIAL/PLATELET
Basophils Absolute: 0.1 10*3/uL (ref 0.0–0.1)
Basophils Relative: 1.5 % (ref 0.0–3.0)
Eosinophils Absolute: 0.1 10*3/uL (ref 0.0–0.7)
Eosinophils Relative: 1.9 % (ref 0.0–5.0)
HCT: 46.4 % (ref 39.0–52.0)
Hemoglobin: 15.9 g/dL (ref 13.0–17.0)
Lymphocytes Relative: 34.5 % (ref 12.0–46.0)
Lymphs Abs: 2 10*3/uL (ref 0.7–4.0)
MCHC: 34.3 g/dL (ref 30.0–36.0)
MCV: 89.6 fl (ref 78.0–100.0)
Monocytes Absolute: 0.4 10*3/uL (ref 0.1–1.0)
Monocytes Relative: 6.7 % (ref 3.0–12.0)
Neutro Abs: 3.2 10*3/uL (ref 1.4–7.7)
Neutrophils Relative %: 55.4 % (ref 43.0–77.0)
Platelets: 206 10*3/uL (ref 150.0–400.0)
RBC: 5.18 Mil/uL (ref 4.22–5.81)
RDW: 13.4 % (ref 11.5–15.5)
WBC: 5.8 10*3/uL (ref 4.0–10.5)

## 2017-01-31 LAB — LIPID PANEL
Cholesterol: 181 mg/dL (ref 0–200)
HDL: 42.7 mg/dL (ref >39.00)
LDL Cholesterol: 123 mg/dL — ABNORMAL HIGH (ref 0–99)
NonHDL: 137.84
Total CHOL/HDL Ratio: 4
Triglycerides: 76 mg/dL (ref 0.0–149.0)
VLDL: 15.2 mg/dL (ref 0.0–40.0)

## 2017-01-31 LAB — PSA: PSA: 0.7 ng/mL (ref 0.10–4.00)

## 2017-01-31 LAB — TESTOSTERONE: Testosterone: 348.29 ng/dL (ref 300.00–890.00)

## 2017-02-01 ENCOUNTER — Ambulatory Visit (INDEPENDENT_AMBULATORY_CARE_PROVIDER_SITE_OTHER): Payer: BLUE CROSS/BLUE SHIELD | Admitting: Family Medicine

## 2017-02-01 ENCOUNTER — Encounter: Payer: Self-pay | Admitting: Family Medicine

## 2017-02-01 VITALS — BP 122/88 | HR 64 | Ht 68.25 in | Wt 181.0 lb

## 2017-02-01 DIAGNOSIS — Z1211 Encounter for screening for malignant neoplasm of colon: Secondary | ICD-10-CM | POA: Diagnosis not present

## 2017-02-01 DIAGNOSIS — R7989 Other specified abnormal findings of blood chemistry: Secondary | ICD-10-CM | POA: Diagnosis not present

## 2017-02-01 DIAGNOSIS — Z0000 Encounter for general adult medical examination without abnormal findings: Secondary | ICD-10-CM | POA: Diagnosis not present

## 2017-02-01 MED ORDER — HYDROCODONE-ACETAMINOPHEN 5-325 MG PO TABS: 1.0000 | ORAL_TABLET | ORAL | 0 refills | Status: DC | PRN

## 2017-02-01 MED ORDER — CLONAZEPAM 0.5 MG PO TABS: ORAL_TABLET | ORAL | 0 refills | Status: DC

## 2017-02-01 NOTE — Assessment & Plan Note (Signed)
Reviewed preventive care protocols, scheduled due services, and updated immunizations Discussed nutrition, exercise, diet, and healthy lifestyle.  

## 2017-02-01 NOTE — Patient Instructions (Addendum)
Great to see you.  We are referring you for a colonoscopy.  Call your insurance company about which topical testosterone they prefer to cover.

## 2017-02-01 NOTE — Progress Notes (Signed)
Subjective:   Patient ID: Jesse Neal, male    DOB: 07/05/1966, 51 y.o.   MRN: [id number]  Jesse Neal is a pleasant 51 y.o. year old male who presents to clinic today with Annual Exam  on 02/01/2017  HPI:  Colonoscopy 06/17/04 Overdue for colonoscopy and he knows his IBS has been acting up.  Having more bloating/lower abdominal tightness.  Paroxysmal SVT- followed by Dr. Gigi Gin.  Has had no issues- takes as needed metoprolol but has not needed to take it.  Overall doing well, applying for a new job.  Does feel a bit fatigued- testosterone was on the lower end of normal this month.  Lab Results  Component Value Date   CHOL 181 01/31/2017   HDL 42.70 01/31/2017   LDLCALC 123 (H) 01/31/2017   LDLDIRECT 103.0 01/24/2016   TRIG 76.0 01/31/2017   CHOLHDL 4 01/31/2017   Lab Results  Component Value Date   CREATININE 1.01 01/31/2017   Lab Results  Component Value Date   WBC 5.8 01/31/2017   HGB 15.9 01/31/2017   HCT 46.4 01/31/2017   MCV 89.6 01/31/2017   PLT 206.0 01/31/2017   Lab Results  Component Value Date   NA 142 01/31/2017   K 4.5 01/31/2017   CL 107 01/31/2017   CO2 30 01/31/2017   Lab Results  Component Value Date   ALT 17 01/31/2017   AST 14 01/31/2017   ALKPHOS 54 01/31/2017   BILITOT 0.6 01/31/2017     Current Outpatient Prescriptions on File Prior to Visit  Medication Sig Dispense Refill  . acetaminophen (TYLENOL) 325 MG tablet Take 650 mg by mouth every 6 (six) hours as needed.    . clonazePAM (KLONOPIN) 0.5 MG tablet Take one half to one tablet by mouth daily 30 tablet 0   No current facility-administered medications on file prior to visit.     Allergies  Allergen Reactions  . Bactrim [Sulfamethoxazole-Trimethoprim] Rash    Past Medical History:  Diagnosis Date  . Anxiety   . GERD (gastroesophageal reflux disease)   . SVT (supraventricular tachycardia) (HCC)    Dr. Gigi Gin    Past Surgical History:  Procedure  Laterality Date  . COLONOSCOPY  2006  . LUMBAR DISC SURGERY  12/2004   L4-5  . TONSILLECTOMY      Family History  Problem Relation Age of Onset  . Cancer Father 4       Prostate  . Hyperlipidemia Father   . Colon cancer Neg Hx     Social History   Social History  . Marital status: Married    Spouse name: N/A  . Number of children: N/A  . Years of education: N/A   Occupational History  . Not on file.   Social History Main Topics  . Smoking status: Never Smoker  . Smokeless tobacco: Never Used  . Alcohol use Yes     Comment: once monthly  . Drug use: No  . Sexual activity: Not on file   Other Topics Concern  . 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   The PMH, PSH, Social History, Family History, Medications, and allergies have been reviewed in [facility], and have been updated if relevant.   Review of Systems  Constitutional: Positive for fatigue.  HENT: Negative.   Eyes: Negative.   Respiratory: Negative.   Cardiovascular: Negative.   Gastrointestinal: Positive for abdominal distention.  Endocrine: Negative.  Genitourinary: Negative.   Musculoskeletal: Negative.   Skin: Negative.   Allergic/Immunologic: Negative.   Neurological: Negative.   Hematological: Negative.   Psychiatric/Behavioral: Negative.   All other systems reviewed and are negative.      Objective:    BP 122/88   Pulse 64   Ht 5' 8.25" (1.734 m)   Wt 181 lb (82.1 kg)   SpO2 98%   BMI 27.32 kg/m    Physical Exam  General:  pleasant male in no acute distress Eyes:  PERRL Ears:  External ear exam shows no significant lesions or deformities.  TMs normal bilaterally Hearing is grossly normal bilaterally. Nose:  External nasal examination shows no deformity or inflammation. Nasal mucosa are pink and moist without lesions or exudates. Mouth:  Oral mucosa and oropharynx without lesions or exudates.  Teeth in good  repair. Neck:  no carotid bruit or thyromegaly no cervical or supraclavicular lymphadenopathy  Lungs:  Normal respiratory effort, chest expands symmetrically. Lungs are clear to auscultation, no crackles or wheezes. Heart:  Normal rate and regular rhythm. S1 and S2 normal without gallop, murmur, click, rub or other extra sounds. Abdomen:  Bowel sounds positive,abdomen soft and non-tender without masses, organomegaly or hernias noted. Pulses:  R and L posterior tibial pulses are full and equal bilaterally  Extremities:  no edema  Psych:  Good eye contact, not anxious or depressed appearing       Assessment & Plan:   Visit for well man health check  Low testosterone No Follow-up on file.

## 2017-02-05 ENCOUNTER — Encounter: Payer: Self-pay | Admitting: Family Medicine

## 2017-03-01 ENCOUNTER — Encounter: Payer: Self-pay | Admitting: Family Medicine

## 2017-03-02 ENCOUNTER — Telehealth: Payer: Self-pay

## 2017-03-02 ENCOUNTER — Other Ambulatory Visit: Payer: Self-pay

## 2017-03-02 DIAGNOSIS — Z1211 Encounter for screening for malignant neoplasm of colon: Secondary | ICD-10-CM

## 2017-03-02 NOTE — Telephone Encounter (Signed)
Gastroenterology Pre-Procedure Review  Request Date: 04/19/17 Requesting Physician: Dr. Vicente Males  PATIENT REVIEW QUESTIONS: The patient responded to the following health history questions as indicated:    1. Are you having any GI issues? yes (Diarrhea and insconsistency with bowels) 2. Do you have a personal history of Polyps? no 3. Do you have a family history of Colon Cancer or Polyps? no 4. Diabetes Mellitus? no 5. Joint replacements in the past 12 months?no 6. Major health problems in the past 3 months?no 7. Any artificial heart valves, MVP, or defibrillator?no    MEDICATIONS & ALLERGIES:    Patient reports the following regarding taking any anticoagulation/antiplatelet therapy:   Plavix, Coumadin, Eliquis, Xarelto, Lovenox, Pradaxa, Brilinta, or Effient? no Aspirin? no  Patient confirms/reports the following medications:  Current Outpatient Prescriptions  Medication Sig Dispense Refill  . acetaminophen (TYLENOL) 325 MG tablet Take 650 mg by mouth every 6 (six) hours as needed.    . clonazePAM (KLONOPIN) 0.5 MG tablet Take one half to one tablet by mouth daily 30 tablet 0  . HYDROcodone-acetaminophen (NORCO/VICODIN) 5-325 MG tablet Take 1 tablet by mouth as needed for moderate pain. 20 tablet 0   No current facility-administered medications for this visit.     Patient confirms/reports the following allergies:  Allergies  Allergen Reactions  . Bactrim [Sulfamethoxazole-Trimethoprim] Rash    No orders of the defined types were placed in this encounter.   AUTHORIZATION INFORMATION Primary Insurance: 1D#: Group #:  Secondary Insurance: 1D#: Group #:  SCHEDULE INFORMATION: Date: 04/917 Time: Location: [place]

## 2017-04-19 ENCOUNTER — Ambulatory Visit
Admission: RE | Admit: 2017-04-19 | Discharge: 2017-04-19 | Disposition: A | Discharge status: Home / Self Care | Payer: BLUE CROSS/BLUE SHIELD | Source: Ambulatory Visit | Attending: Gastroenterology | Admitting: Gastroenterology

## 2017-04-19 ENCOUNTER — Encounter: Payer: Self-pay | Admitting: *Deleted

## 2017-04-19 ENCOUNTER — Ambulatory Visit: Payer: BLUE CROSS/BLUE SHIELD | Admitting: Anesthesiology

## 2017-04-19 ENCOUNTER — Encounter
Admission: RE | Disposition: A | Discharge status: Home / Self Care | Payer: Self-pay | Source: Ambulatory Visit | Attending: Gastroenterology

## 2017-04-19 DIAGNOSIS — F419 Anxiety disorder, unspecified: Secondary | ICD-10-CM | POA: Diagnosis not present

## 2017-04-19 DIAGNOSIS — Z79899 Other long term (current) drug therapy: Secondary | ICD-10-CM | POA: Insufficient documentation

## 2017-04-19 DIAGNOSIS — Z1211 Encounter for screening for malignant neoplasm of colon: Secondary | ICD-10-CM | POA: Diagnosis not present

## 2017-04-19 DIAGNOSIS — K219 Gastro-esophageal reflux disease without esophagitis: Secondary | ICD-10-CM | POA: Diagnosis not present

## 2017-04-19 DIAGNOSIS — I471 Supraventricular tachycardia: Secondary | ICD-10-CM | POA: Insufficient documentation

## 2017-04-19 HISTORY — PX: COLONOSCOPY WITH PROPOFOL: SHX5780

## 2017-04-19 HISTORY — DX: Cardiac arrhythmia, unspecified: I49.9

## 2017-04-19 SURGERY — COLONOSCOPY WITH PROPOFOL
Anesthesia: General

## 2017-04-19 MED ORDER — FENTANYL CITRATE (PF) 100 MCG/2ML IJ SOLN
INTRAMUSCULAR | Status: DC | PRN
Start: 2017-04-19 — End: 2017-04-19
  Administered 2017-04-19: 50 ug via INTRAVENOUS

## 2017-04-19 MED ORDER — PROPOFOL 500 MG/50ML IV EMUL
INTRAVENOUS | Status: AC
  Filled 2017-04-19: qty 50

## 2017-04-19 MED ORDER — PROPOFOL 500 MG/50ML IV EMUL
INTRAVENOUS | Status: DC | PRN
  Administered 2017-04-19: 120 ug/kg/min via INTRAVENOUS

## 2017-04-19 MED ORDER — MIDAZOLAM HCL 2 MG/2ML IJ SOLN
INTRAMUSCULAR | Status: DC | PRN
  Administered 2017-04-19: 2 mg via INTRAVENOUS

## 2017-04-19 MED ORDER — FENTANYL CITRATE (PF) 100 MCG/2ML IJ SOLN
INTRAMUSCULAR | Status: AC
  Filled 2017-04-19: qty 2

## 2017-04-19 MED ORDER — MIDAZOLAM HCL 2 MG/2ML IJ SOLN
INTRAMUSCULAR | Status: AC
  Filled 2017-04-19: qty 2

## 2017-04-19 MED ORDER — SODIUM CHLORIDE 0.9 % IV SOLN
INTRAVENOUS | Status: DC
  Administered 2017-04-19: 08:00:00 via INTRAVENOUS

## 2017-04-19 NOTE — Op Note (Signed)
[facility] Gastroenterology Patient Name: Jesse Neal Procedure Date: 04/19/2017 8:43 AM MRN: [id number] Account #: [id number] Date of Birth: 07/08/1966 Admit Type: Outpatient Age: 51 Room: [facility] ENDO ROOM 4 Gender: Male Note Status: Finalized Procedure:            Colonoscopy Indications:          Screening for colorectal malignant neoplasm Providers:            Jonathon Bellows MD, MD Referring MD:         Marciano Sequin. Deborra Medina (Referring MD) Medicines:            Monitored Anesthesia Care Complications:        No immediate complications. Procedure:            Pre-Anesthesia Assessment:                       - Prior to the procedure, a History and Physical was                        performed, and patient medications, allergies and                        sensitivities were reviewed. The patient's tolerance of                        previous anesthesia was reviewed.                       - The risks and benefits of the procedure and the                        sedation options and risks were discussed with the                        patient. All questions were answered and informed                        consent was obtained.                       - ASA Grade Assessment: III - A patient with severe                        systemic disease.                       After obtaining informed consent, the colonoscope was                        passed under direct vision. Throughout the procedure,                        the patient's blood pressure, pulse, and oxygen                        saturations were monitored continuously. The                        Colonoscope was introduced through the anus and  advanced to the the cecum, identified by the                        appendiceal orifice, IC valve and transillumination.                        The colonoscopy was performed with ease. The patient                        tolerated the procedure well. The quality  of the bowel                        preparation was excellent. Findings:      The exam was otherwise without abnormality on direct and retroflexion       views.      The entire examined colon appeared normal. Impression:           - The examination was otherwise normal on direct and                        retroflexion views.                       - The entire examined colon is normal.                       - No specimens collected. Recommendation:       - Discharge patient to home (with escort).                       - Resume previous diet.                       - Continue present medications.                       - Repeat colonoscopy in 10 years for screening purposes. Procedure Code(s):    --- Professional ---                       L8756, Colorectal cancer screening; colonoscopy on                        individual not meeting criteria for high risk Diagnosis Code(s):    --- Professional ---                       Z12.11, Encounter for screening for malignant neoplasm                        of colon CPT copyright 2016 American Medical Association. All rights reserved. The codes documented in this report are preliminary and upon coder review may  be revised to meet current compliance requirements. Jonathon Bellows, MD Jonathon Bellows MD, MD 04/19/2017 9:07:18 AM This report has been signed electronically. Number of Addenda: 0 Note Initiated On: 04/19/2017 8:43 AM Scope Withdrawal Time: 0 hours 10 minutes 11 seconds  Total Procedure Duration: 0 hours 13 minutes 36 seconds       [facility]

## 2017-04-19 NOTE — Anesthesia Preprocedure Evaluation (Signed)
Anesthesia Evaluation  Patient identified by MRN, date of birth, ID band Patient awake    Reviewed: Allergy & Precautions, H&P , NPO status , Patient's Chart, lab work & pertinent test results  History of Anesthesia Complications Negative for: history of anesthetic complications  Airway Mallampati: III  TM Distance: >3 FB Neck ROM: full    Dental  (+) Chipped   Pulmonary neg pulmonary ROS, neg shortness of breath,           Cardiovascular Exercise Tolerance: Good (-) angina(-) Past MI and (-) DOE + dysrhythmias Supra Ventricular Tachycardia      Neuro/Psych PSYCHIATRIC DISORDERS negative neurological ROS     GI/Hepatic Neg liver ROS, GERD  Medicated and Controlled,  Endo/Other  negative endocrine ROS  Renal/GU negative Renal ROS  negative genitourinary   Musculoskeletal  (+) Arthritis ,   Abdominal   Peds  Hematology negative hematology ROS (+)   Anesthesia Other Findings Past Medical History: No date: Anxiety No date: Dysrhythmia No date: GERD (gastroesophageal reflux disease) No date: SVT (supraventricular tachycardia) (HCC)     Comment:  Dr. Gigi Gin  Past Surgical History: No date: BACK SURGERY 2006: COLONOSCOPY 12/2004: LUMBAR DISC SURGERY     Comment:  L4-5 No date: TONSILLECTOMY  BMI    Body Mass Index:  25.39 kg/m      Reproductive/Obstetrics negative OB ROS                             Anesthesia Physical Anesthesia Plan  ASA: III  Anesthesia Plan: General   Post-op Pain Management:    Induction: Intravenous  PONV Risk Score and Plan:   Airway Management Planned: Natural Airway and Nasal Cannula  Additional Equipment:   Intra-op Plan:   Post-operative Plan:   Informed Consent: I have reviewed the patients History and Physical, chart, labs and discussed the procedure including the risks, benefits and alternatives for the proposed anesthesia with the  patient or authorized representative who has indicated his/her understanding and acceptance.   Dental Advisory Given  Plan Discussed with: Anesthesiologist, CRNA and Surgeon  Anesthesia Plan Comments: (Patient consented for risks of anesthesia including but not limited to:  - adverse reactions to medications - risk of intubation if required - damage to teeth, lips or other oral mucosa - sore throat or hoarseness - Damage to heart, brain, lungs or loss of life  Patient voiced understanding.)        Anesthesia Quick Evaluation

## 2017-04-19 NOTE — Discharge Instructions (Signed)
[email or web address] that left eye is fussy and he cannot see out of that eye.right eye is fine.He feels it might be allergy.Left eye is irrigated with normal saline.Patient is advised to call back if problem continues with left eye.

## 2017-04-19 NOTE — Transfer of Care (Signed)
Immediate Anesthesia Transfer of Care Note  Patient: ADD DINAPOLI  Procedure(s) Performed: Procedure(s): COLONOSCOPY WITH PROPOFOL (N/A)  Patient Location: PACU  Anesthesia Type:General  Level of Consciousness: awake and sedated  Airway & Oxygen Therapy: Patient Spontanous Breathing and Patient connected to nasal cannula oxygen  Post-op Assessment: Report given to RN and Post -op Vital signs reviewed and stable  Post vital signs: Reviewed and stable  Last Vitals:  Vitals:   04/19/17 0753  BP: 133/82  Pulse: 67  Resp: 18  Temp: (!) 36.1 C  SpO2: 100%    Last Pain:  Vitals:   04/19/17 0753  TempSrc: Tympanic         Complications: No apparent anesthesia complications

## 2017-04-19 NOTE — Anesthesia Procedure Notes (Signed)
Performed by: COOK-MARTIN, Jrue Jarriel Pre-anesthesia Checklist: Patient identified, Emergency Drugs available, Suction available, Patient being monitored and Timeout performed Patient Re-evaluated:Patient Re-evaluated prior to induction Oxygen Delivery Method: Nasal cannula Preoxygenation: Pre-oxygenation with 100% oxygen Induction Type: IV induction Placement Confirmation: CO2 detector and positive ETCO2       

## 2017-04-19 NOTE — Anesthesia Post-op Follow-up Note (Signed)
Anesthesia QCDR form completed.        

## 2017-04-19 NOTE — Anesthesia Postprocedure Evaluation (Signed)
Anesthesia Post Note  Patient: Jesse Neal  Procedure(s) Performed: Procedure(s) (LRB): COLONOSCOPY WITH PROPOFOL (N/A)  Patient location during evaluation: Endoscopy Anesthesia Type: General Level of consciousness: awake and alert Pain management: pain level controlled Vital Signs Assessment: post-procedure vital signs reviewed and stable Respiratory status: spontaneous breathing, nonlabored ventilation, respiratory function stable and patient connected to nasal cannula oxygen Cardiovascular status: blood pressure returned to baseline and stable Postop Assessment: no signs of nausea or vomiting Anesthetic complications: no     Last Vitals:  Vitals:   04/19/17 0929 04/19/17 0939  BP: (!) 119/91 114/83  Pulse: 68 66  Resp: 15 15  Temp:    SpO2: 99% 98%    Last Pain:  Vitals:   04/19/17 0909  TempSrc: Tympanic                 Precious Haws Piscitello

## 2017-04-19 NOTE — H&P (Addendum)
  Jonathon Bellows MD [address] [place], [place] [address] Phone: [phone] Fax : [phone]  Primary Care Physician:  Lucille Passy, MD Primary Gastroenterologist:  Dr. Jonathon Bellows   Pre-Procedure History & Physical: HPI:  Jesse Neal is a 51 y.o. male is here for an colonoscopy.Denies any diarrhea presently    Past Medical History:  Diagnosis Date  . Anxiety   . Dysrhythmia   . GERD (gastroesophageal reflux disease)   . SVT (supraventricular tachycardia) (HCC)    Dr. Gigi Gin    Past Surgical History:  Procedure Laterality Date  . BACK SURGERY    . COLONOSCOPY  2006  . LUMBAR DISC SURGERY  12/2004   L4-5  . TONSILLECTOMY      Prior to Admission medications   Medication Sig Start Date End Date Taking? Authorizing Provider  acetaminophen (TYLENOL) 325 MG tablet Take 650 mg by mouth every 6 (six) hours as needed.   Yes [provider]  clonazePAM (KLONOPIN) 0.5 MG tablet Take one half to one tablet by mouth daily 02/01/17  Yes Lucille Passy, MD  HYDROcodone-acetaminophen (NORCO/VICODIN) 5-325 MG tablet Take 1 tablet by mouth as needed for moderate pain. Patient not taking: Reported on 04/19/2017 02/01/17   Lucille Passy, MD    Allergies as of 03/02/2017 - Review Complete 02/01/2017  Allergen Reaction Noted  . Bactrim [sulfamethoxazole-trimethoprim] Rash 08/04/2015    Family History  Problem Relation Age of Onset  . Cancer Father 63       Prostate  . Hyperlipidemia Father   . Colon cancer Neg Hx     Social History   Social History  . Marital status: Married    Spouse name: N/A  . Number of children: N/A  . Years of education: N/A   Occupational History  . Not on file.   Social History Main Topics  . Smoking status: Never Smoker  . Smokeless tobacco: Never Used  . Alcohol use Yes     Comment: once monthly  . Drug use: No  . Sexual activity: Not on file   Other Topics Concern  . Not on file   Social History Narrative   Jesse Neal specialist   Married, 2 children- youngest 26 yo.    Review of Systems: See HPI, otherwise negative ROS  Physical Exam: BP 133/82   Pulse 67   Temp (!) 97 F (36.1 C) (Tympanic)   Resp 18   Ht 5\' 8"  (1.727 m)   Wt 167 lb (75.8 kg)   SpO2 100%   BMI 25.39 kg/m  General:   Alert,  pleasant and cooperative in NAD Head:  Normocephalic and atraumatic. Neck:  Supple; no masses or thyromegaly. Lungs:  Clear throughout to auscultation.    Heart:  Regular rate and rhythm. Abdomen:  Soft, nontender and nondistended. Normal bowel sounds, without guarding, and without rebound.   Neurologic:  Alert and  oriented x4;  grossly normal neurologically.  Impression/Plan: Jesse Neal is here for an colonoscopy to be performed for Screening colonoscopy average risk    Risks, benefits, limitations, and alternatives regarding  colonoscopy have been reviewed with the patient.  Questions have been answered.  All parties agreeable.   Jonathon Bellows, MD  04/19/2017, 8:09 AM

## 2017-04-20 ENCOUNTER — Encounter: Payer: Self-pay | Admitting: Gastroenterology

## 2017-06-20 ENCOUNTER — Ambulatory Visit (INDEPENDENT_AMBULATORY_CARE_PROVIDER_SITE_OTHER): Payer: BLUE CROSS/BLUE SHIELD | Admitting: Internal Medicine

## 2017-06-20 ENCOUNTER — Encounter: Payer: Self-pay | Admitting: Internal Medicine

## 2017-06-20 VITALS — BP 114/80 | HR 58 | Temp 97.8°F | Wt 175.0 lb

## 2017-06-20 DIAGNOSIS — R1013 Epigastric pain: Secondary | ICD-10-CM | POA: Diagnosis not present

## 2017-06-20 DIAGNOSIS — K2100 Gastro-esophageal reflux disease with esophagitis, without bleeding: Secondary | ICD-10-CM

## 2017-06-20 DIAGNOSIS — R109 Unspecified abdominal pain: Secondary | ICD-10-CM | POA: Insufficient documentation

## 2017-06-20 MED ORDER — OMEPRAZOLE 20 MG PO CPDR: 20.0000 mg | DELAYED_RELEASE_CAPSULE | Freq: Two times a day (BID) | ORAL | 3 refills | Status: DC

## 2017-06-20 NOTE — Assessment & Plan Note (Signed)
Clear cut heartburn/esophagitis type symptoms since eating that lasagna--but also epigastric discomfort and relief with eating suggestive of ulcer H pylori in past--will check stool antigen [facility] rx with PPI but antibiotics if antigen positive

## 2017-06-20 NOTE — Addendum Note (Signed)
Addended by: Mady Haagensen on: 06/20/2017 02:39 PM   Modules accepted: Orders

## 2017-06-20 NOTE — Progress Notes (Signed)
   Subjective:    Patient ID: Jesse Neal, male    DOB: 10-30-1965, 51 y.o.   MRN: [id number]  HPI Here due to abdominal issues  Always had some stomach troubles--like IBS Careful with eating Some issues with bloating and some pain this summer Did have colonoscopy--okay Stomach seemed better  Then 3 weeks ago, had some store bought lasagna Had the worst heartburn ever for 3 days--and persistent heartburn since then Tried a variety of stuff --like gaviscon Had to sleep sitting up for a while Feels somewhat better after eating--but then sense like "brick in my stomach" Heartburn is above diaphragm but "brick-like" feeling is epigastrium or lower Having water brash now  Tried to cut out coke, using ginger tea OTC nexium has helped a little  Current Outpatient Prescriptions on File Prior to Visit  Medication Sig Dispense Refill  . acetaminophen (TYLENOL) 325 MG tablet Take 650 mg by mouth every 6 (six) hours as needed.    . clonazePAM (KLONOPIN) 0.5 MG tablet Take one half to one tablet by mouth daily 30 tablet 0  . HYDROcodone-acetaminophen (NORCO/VICODIN) 5-325 MG tablet Take 1 tablet by mouth as needed for moderate pain. 20 tablet 0   No current facility-administered medications on file prior to visit.     Allergies  Allergen Reactions  . Bactrim [Sulfamethoxazole-Trimethoprim] Rash    Past Medical History:  Diagnosis Date  . Anxiety   . Dysrhythmia   . GERD (gastroesophageal reflux disease)   . SVT (supraventricular tachycardia) (HCC)    Dr. Gigi Gin    Past Surgical History:  Procedure Laterality Date  . BACK SURGERY    . COLONOSCOPY  2006  . COLONOSCOPY WITH PROPOFOL N/A 04/19/2017   Procedure: COLONOSCOPY WITH PROPOFOL;  Surgeon: Jonathon Bellows, MD;  Location: [facility] ENDOSCOPY;  Service: Endoscopy;  Laterality: N/A;  . LUMBAR DISC SURGERY  12/2004   L4-5  . TONSILLECTOMY      Family History  Problem Relation Age of Onset  . Cancer Father 13       Prostate    . Hyperlipidemia Father   . Colon cancer Neg Hx      Review of Systems Had H pylori 5-8 years ago. Got antibiotics/PPI at that time Off PPI for many years No weight loss Not running as much lately though Appetite okay No SOB but does have sense of phlegm in throat. No regular cough Bowels not bad--not really the diarrhea    Objective:   Physical Exam  Constitutional: He appears well-nourished. No distress.  Neck: No thyromegaly present.  Pulmonary/Chest: Effort normal and breath sounds normal. No respiratory distress. He has no wheezes. He has no rales.  Abdominal: Soft. Bowel sounds are normal. He exhibits no distension and no mass. There is no tenderness. There is no rebound and no guarding.  Lymphadenopathy:    He has no cervical adenopathy.  Psychiatric: He has a normal mood and affect. His behavior is normal.          Assessment & Plan:

## 2017-06-20 NOTE — Assessment & Plan Note (Signed)
Clear symptoms since eating that lasagna Will treat with PPI bid for at least 6 weeks, then can wean down if symptoms gone

## 2017-06-20 NOTE — Patient Instructions (Signed)
Take the omeprazole for at least 6 weeks--if your symptoms are gone then, you can reduce to daily for a while and then try to wean off.

## 2017-06-21 DIAGNOSIS — R1013 Epigastric pain: Secondary | ICD-10-CM | POA: Diagnosis not present

## 2017-06-21 NOTE — Addendum Note (Signed)
Addended by: Ellamae Sia on: 06/21/2017 10:41 AM   Modules accepted: Orders

## 2017-06-22 LAB — HELICOBACTER PYLORI  SPECIAL ANTIGEN
MICRO NUMBER:: 81135008
SPECIMEN QUALITY: ADEQUATE

## 2017-06-27 ENCOUNTER — Encounter: Payer: Self-pay | Admitting: Internal Medicine

## 2017-07-12 ENCOUNTER — Other Ambulatory Visit: Payer: Self-pay | Admitting: Family Medicine

## 2017-07-12 ENCOUNTER — Encounter: Payer: Self-pay | Admitting: Family Medicine

## 2017-07-12 MED ORDER — CLONAZEPAM 0.5 MG PO TABS: ORAL_TABLET | ORAL | 0 refills | Status: DC

## 2017-07-12 NOTE — Telephone Encounter (Signed)
Called Rx to pharmacy/thx dmf

## 2017-07-12 NOTE — Telephone Encounter (Signed)
Requesting: Clonazepam Contract: None  UDS: None Last OV: TA-5.24.2018; RL-10.10.2018 Next OV: Not scheduled Last Refill: 5.24.2018   Please advise

## 2017-07-24 ENCOUNTER — Encounter: Payer: Self-pay | Admitting: Gastroenterology

## 2017-07-24 ENCOUNTER — Ambulatory Visit (INDEPENDENT_AMBULATORY_CARE_PROVIDER_SITE_OTHER): Payer: BLUE CROSS/BLUE SHIELD | Admitting: Gastroenterology

## 2017-07-24 VITALS — BP 117/78 | HR 69 | Temp 97.7°F | Ht 68.25 in | Wt 171.0 lb

## 2017-07-24 DIAGNOSIS — K582 Mixed irritable bowel syndrome: Secondary | ICD-10-CM | POA: Diagnosis not present

## 2017-07-24 DIAGNOSIS — K219 Gastro-esophageal reflux disease without esophagitis: Secondary | ICD-10-CM | POA: Diagnosis not present

## 2017-07-24 MED ORDER — PSYLLIUM 95 % PO PACK: 1.0000 | PACK | Freq: Every day | ORAL | Status: AC

## 2017-07-24 NOTE — Progress Notes (Signed)
Jesse Bellows MD, MRCP(U.K) [address]  [place], [place] [address]  Main: [phone]  Fax: [phone]   Gastroenterology Consultation  Referring Provider:     Lucille Passy, MD Primary Care Physician:  Jesse Passy, MD Primary Gastroenterologist:  Dr. Jonathon Neal  Reason for Consultation:     Abdominal pain          HPI:   Jesse Neal is a 51 y.o. y/o male referred for consultation & management  by Dr. Deborra Neal, Jesse Sequin, MD.    He has been referred for heartburn,abdominal pain . Recent H pylori stool antigen was negative. Labs 01/2017- Hb 15.9 .No recent abdominal imaging.    He says that he has had on and off stomach issues and was diagnosed with IBS for the past 20 years. He describes his symptoms as bloating , abdominal pain, heart burn , He feels when he eats "bad food" , when he cuts back feels better.   Off all the symptoms says the heartburn is the worse. He says that the last 8 weeks is everytime, all day long . Better after he eats, worse when he lies flat. He has been taking omeprazole 20 mg BID , has taken with and after foods. No significant benefit since starting . He says that he has had some right sided abdominal pain , on and off , At work it is bad, when he went to [place] was better. Eating makes it worse, usually 1-2 hours after. Denies any gall bladder issues . Describes the pain as a pressure, says that he has no relieving factors.Denies any NSAID issues. Some bloating when he has the pain , relived with a bowel movement or passage of gas. He has a bowel movement once a day - it can range from soft to semi losse-hard then changes again . Overall he says he is more often on the "softer side" . 1 diet soda a day , no sweet n low, no chewing gum, no crystalite. No processed foods. Has noticed more symptoms when he takes in more artificial sweetners.    Colonoscopy on 04/2017 -normal  Past Medical History:  Diagnosis Date  . Anxiety   .  Dysrhythmia   . GERD (gastroesophageal reflux disease)   . SVT (supraventricular tachycardia) (HCC)    Dr. Gigi Neal    Past Surgical History:  Procedure Laterality Date  . BACK SURGERY    . COLONOSCOPY  2006  . LUMBAR DISC SURGERY  12/2004   L4-5  . TONSILLECTOMY      Prior to Admission medications   Medication Sig Start Date End Date Taking? Authorizing Provider  acetaminophen (TYLENOL) 325 MG tablet Take 650 mg by mouth every 6 (six) hours as needed.   Yes [provider]  clonazePAM (KLONOPIN) 0.5 MG tablet Take one half to one tablet by mouth daily 07/12/17  Yes Jesse Passy, MD  omeprazole (PRILOSEC) 20 MG capsule Take 1 capsule (20 mg total) by mouth 2 (two) times daily before a meal. 06/20/17  Yes Venia Carbon, MD  HYDROcodone-acetaminophen (NORCO/VICODIN) 5-325 MG tablet Take 1 tablet by mouth as needed for moderate pain. Patient not taking: Reported on 07/24/2017 02/01/17   Jesse Passy, MD    Family History  Problem Relation Age of Onset  . Cancer Father 43       Prostate  . Hyperlipidemia Father   . Colon cancer Neg Hx      Social History  Tobacco Use  . Smoking status: Never Smoker  . Smokeless tobacco: Never Used  Substance Use Topics  . Alcohol use: Yes    Comment: once monthly  . Drug use: No    Allergies as of 07/24/2017 - Review Complete 07/24/2017  Allergen Reaction Noted  . Bactrim [sulfamethoxazole-trimethoprim] Rash 08/04/2015    Review of Systems:    All systems reviewed and negative except where noted in HPI.   Physical Exam:  BP 117/78   Pulse 69   Temp 97.7 F (36.5 C) (Oral)   Ht 5' 8.25" (1.734 m)   Wt 171 lb (77.6 kg)   BMI 25.81 kg/m  No LMP for male patient. Psych:  Alert and cooperative. Normal mood and affect. General:   Alert,  Well-developed, well-nourished, pleasant and cooperative in NAD Head:  Normocephalic and atraumatic. Eyes:  Sclera clear, no icterus.   Conjunctiva pink. Ears:  Normal auditory  acuity. Nose:  No deformity, discharge, or lesions. Mouth:  No deformity or lesions,oropharynx pink & moist. Neck:  Supple; no masses or thyromegaly. Lungs:  Respirations even and unlabored.  Clear throughout to auscultation.   No wheezes, crackles, or rhonchi. No acute distress. Heart:  Regular rate and rhythm; no murmurs, clicks, rubs, or gallops. Abdomen:  Normal bowel sounds.  No bruits.  Soft, non-tender and non-distended without masses, hepatosplenomegaly or hernias noted.  No guarding or rebound tenderness.    Extremities:  No clubbing or edema.  No cyanosis. Neurologic:  Alert and oriented x3;  grossly normal neurologically. Skin:  Intact without significant lesions or rashes. No jaundice. Lymph Nodes:  No significant cervical adenopathy. Psych:  Alert and cooperative. Normal mood and affect.  Imaging Studies: No results found.  Assessment and Plan:   Jesse Neal is a 51 y.o. y/o male has been referred for GERD, abdominal discomfort which is suggestive of IBS-mixed    1. Stop omeprazole 2. Start Dexilant 60 mg a day for 3 months- samples provided. Counseled on life style changes, suggest to use PPI first thing in the morning on empty stomach and eat 30 minutes after. Advised on the use of a wedge pillow at night , avoid meals for 2 hours prior to bed time. Weight loss .Discussed the risks and benefits of long term PPI use including but not limited to bone loss, chronic kidney disease, infections , low magnesium . Aim to use at the lowest dose for the shortest period of time  3. Trial of Psyllium for IBS- if no better can consider other agents vs treatment for SIBO and evaluation of gall bladder,    Follow up in 8 weeks   Dr Jesse Bellows MD,MRCP(U.K)

## 2017-08-13 ENCOUNTER — Encounter: Payer: Self-pay | Admitting: Gastroenterology

## 2017-08-14 ENCOUNTER — Other Ambulatory Visit: Payer: Self-pay

## 2017-08-14 ENCOUNTER — Telehealth: Payer: Self-pay | Admitting: Gastroenterology

## 2017-08-14 MED ORDER — DEXLANSOPRAZOLE 60 MG PO CPDR
60.0000 mg | DELAYED_RELEASE_CAPSULE | Freq: Every day | ORAL | 3 refills | Status: DC
Start: 2017-08-14 — End: 2017-09-24

## 2017-08-14 NOTE — Telephone Encounter (Signed)
*  STAT* If patient is at the pharmacy, call can be transferred to refill team.   1. Which medications need to be refilled? (please list name of each medication and dose if known) Dexilant 60 mg   2. Which pharmacy/location (including street and city if local pharmacy) is medication to be sent to? [facility] on [address]  3. Do they need a 30 day or 90 day supply? 90 day

## 2017-08-16 ENCOUNTER — Telehealth: Payer: Self-pay | Admitting: Gastroenterology

## 2017-08-16 ENCOUNTER — Other Ambulatory Visit: Payer: Self-pay

## 2017-08-16 DIAGNOSIS — K219 Gastro-esophageal reflux disease without esophagitis: Secondary | ICD-10-CM

## 2017-08-16 MED ORDER — SUCRALFATE 1 G PO TABS: 1.0000 g | ORAL_TABLET | Freq: Three times a day (TID) | ORAL | 0 refills | Status: DC

## 2017-08-16 MED ORDER — RANITIDINE HCL 150 MG PO TABS: ORAL_TABLET | ORAL | 0 refills | Status: DC

## 2017-08-16 NOTE — Telephone Encounter (Signed)
Returned patient's call.   Per Dr. Vicente Males sent Rx for Zantac 150mg  (take 1 at night) and sucralfate tabs to pharmacy.

## 2017-08-16 NOTE — Telephone Encounter (Signed)
Patient feels the dexilant is no longer working. I put him on the schedule for Monday. He knows if it snows it will be canceled but he wants you to call him to let him know what else he can do until then. Please call today.

## 2017-08-20 ENCOUNTER — Ambulatory Visit: Payer: BLUE CROSS/BLUE SHIELD | Admitting: Gastroenterology

## 2017-08-27 ENCOUNTER — Encounter: Payer: Self-pay | Admitting: Gastroenterology

## 2017-08-27 ENCOUNTER — Ambulatory Visit (INDEPENDENT_AMBULATORY_CARE_PROVIDER_SITE_OTHER): Payer: BLUE CROSS/BLUE SHIELD | Admitting: Gastroenterology

## 2017-08-27 VITALS — BP 112/74 | HR 64 | Temp 97.7°F | Ht 68.25 in | Wt 167.4 lb

## 2017-08-27 DIAGNOSIS — K582 Mixed irritable bowel syndrome: Secondary | ICD-10-CM

## 2017-08-27 DIAGNOSIS — K219 Gastro-esophageal reflux disease without esophagitis: Secondary | ICD-10-CM | POA: Diagnosis not present

## 2017-08-27 DIAGNOSIS — R1013 Epigastric pain: Secondary | ICD-10-CM

## 2017-08-27 NOTE — Progress Notes (Signed)
Jonathon Bellows MD, MRCP(U.K) [address]  [place]  [place], [place] [address]  Main: [phone]  Fax: [phone]   Primary Care Physician: Lucille Passy, MD  Primary Gastroenterologist:  Dr. Jonathon Bellows   No chief complaint on file.   HPI: Jesse Neal is a 51 y.o. male    Summary of history : He is here today to follow up to his last visit on 07/24/17 . He had been referred for heartburn,abdominal pain . Recent H pylori stool antigen was negative. Labs 01/2017- Hb 15.9 .No recent abdominal imaging. He says that he has had on and off stomach issues and was diagnosed with IBS for the past 20 years. He describes his symptoms as bloating , abdominal pain, heart burn , He feels when he eats "bad food" , when he cuts back feels better. His heartburn was recently worse at his last visit. He says that he has had some right sided abdominal pain , on and off , At work it is bad, when he went to [place] was better. Eating makes it worse, usually 1-2 hours after. Denies any gall bladder issues . Describes the pain as a pressure, says that he has no relieving factors.Denies any NSAID issues. Some bloating when he has the pain , relived with a bowel movement or passage of gas. He has a bowel movement once a day - it can range from soft to semi losse-hard then changes again . Overall he says he is more often on the "softer side" . 1 diet soda a day , no sweet n low, no chewing gum, no crystalite. No processed foods. Has noticed more symptoms when he takes in more artificial sweetners. Colonoscopy on 04/2017 -normal   Interval history  07/2017-08/2017   Dexlant didn't work , changed to zantac and carafate- which helped with the heartburn. Still has abdominal pain , center of the abdomen , can go to the sides, always there , better when he eats. Better with a bowel movement . Tried fiber and says at times it made his symptoms were worse, says had bigger and harder bowel movements. Stool softners  helped. Presently has a bowel movement once a day - has been hard.    Current Outpatient Medications  Medication Sig Dispense Refill  . acetaminophen (TYLENOL) 325 MG tablet Take 650 mg by mouth every 6 (six) hours as needed.    . clonazePAM (KLONOPIN) 0.5 MG tablet Take one half to one tablet by mouth daily 30 tablet 0  . dexlansoprazole (DEXILANT) 60 MG capsule Take 1 capsule (60 mg total) by mouth daily. 90 capsule 3  . HYDROcodone-acetaminophen (NORCO/VICODIN) 5-325 MG tablet Take 1 tablet by mouth as needed for moderate pain. (Patient not taking: Reported on 07/24/2017) 20 tablet 0  . omeprazole (PRILOSEC) 20 MG capsule Take 1 capsule (20 mg total) by mouth 2 (two) times daily before a meal. 60 capsule 3  . ranitidine (ZANTAC) 150 MG tablet Take 1 tablet at bedtime. 30 tablet 0  . sucralfate (CARAFATE) 1 g tablet Take 1 tablet (1 g total) by mouth 4 (four) times daily -  with meals and at bedtime. 30 tablet 0   Current Facility-Administered Medications  Medication Dose Route Frequency Provider Last Rate Last Dose  . psyllium (HYDROCIL/METAMUCIL) packet 1 packet  1 packet Oral Daily Jonathon Bellows, MD        Allergies as of 08/27/2017 - Review Complete 07/24/2017  Allergen Reaction Noted  . Bactrim [sulfamethoxazole-trimethoprim] Rash  08/04/2015    ROS:  General: Negative for anorexia, weight loss, fever, chills, fatigue, weakness. ENT: Negative for hoarseness, difficulty swallowing , nasal congestion. CV: Negative for chest pain, angina, palpitations, dyspnea on exertion, peripheral edema.  Respiratory: Negative for dyspnea at rest, dyspnea on exertion, cough, sputum, wheezing.  GI: See history of present illness. GU:  Negative for dysuria, hematuria, urinary incontinence, urinary frequency, nocturnal urination.  Endo: Negative for unusual weight change.    Physical Examination:   There were no vitals taken for this visit.  General: Well-nourished, well-developed in no acute  distress.  Eyes: No icterus. Conjunctivae pink. Mouth: Oropharyngeal mucosa moist and pink , no lesions erythema or exudate. Lungs: Clear to auscultation bilaterally. Non-labored. Heart: Regular rate and rhythm, no murmurs rubs or gallops.  Abdomen: Bowel sounds are normal, nontender, nondistended, no hepatosplenomegaly or masses, no abdominal bruits or hernia , no rebound or guarding.   Extremities: No lower extremity edema. No clubbing or deformities. Neuro: Alert and oriented x 3.  Grossly intact. Skin: Warm and dry, no jaundice.   Psych: Alert and cooperative, normal mood and affect.   Imaging Studies: No results found.  Assessment and Plan:   Jesse Neal is a 51 y.o. y/o male here to follow up  for GERD, abdominal discomfort which is suggestive of IBS-mixed . GERD doing better but IBS symptoms not much. Appears has more constipation predominant features,    1. Trial of Psyllium for IBS- if no better can consider other agents vs treatment for SIBO and evaluation of gall bladder. 2. CT scan of the abdomen 3. Trial of lizness 72 mcg - he will call if not working to increase dose- Will provide samples. 4. IB guard samples. If all fails will try Amitriptyline at next visit as a pain modulator .    Dr Jonathon Bellows  MD,MRCP [facility]) Follow up in 2-3 weeks

## 2017-08-27 NOTE — Addendum Note (Signed)
Addended by: Peggye Ley on: 08/27/2017 02:44 PM   Modules accepted: Orders

## 2017-08-30 ENCOUNTER — Other Ambulatory Visit: Payer: Self-pay | Admitting: Family Medicine

## 2017-08-30 ENCOUNTER — Other Ambulatory Visit: Payer: Self-pay | Admitting: Gastroenterology

## 2017-08-30 DIAGNOSIS — K219 Gastro-esophageal reflux disease without esophagitis: Secondary | ICD-10-CM

## 2017-08-30 NOTE — Telephone Encounter (Signed)
TA-Last  OV: 5.24.2018/next OV not scheduled/Last Clonazepam approved on 11.01.2018 #30/Plz advise if ok to fill and when you want pt to sched next visit/thx dmf

## 2017-08-31 ENCOUNTER — Encounter: Payer: Self-pay | Admitting: Gastroenterology

## 2017-08-31 MED ORDER — SUCRALFATE 1 G PO TABS: 1.0000 g | ORAL_TABLET | Freq: Three times a day (TID) | ORAL | 0 refills | Status: DC

## 2017-08-31 MED ORDER — RANITIDINE HCL 150 MG PO TABS: ORAL_TABLET | ORAL | 0 refills | Status: DC

## 2017-08-31 NOTE — Telephone Encounter (Signed)
Okay to refill one time only.  Please schedule 30 min OV in the next month or two for follow up. Thank you!

## 2017-08-31 NOTE — Telephone Encounter (Signed)
Called in to [facility] stated that pt needs OV within 30d/I then called pt and LMOVM stating that he needed a 58min OV with TA and gave number and address/thx dmf

## 2017-09-10 ENCOUNTER — Ambulatory Visit: Payer: BLUE CROSS/BLUE SHIELD

## 2017-09-17 ENCOUNTER — Other Ambulatory Visit: Payer: Self-pay

## 2017-09-17 DIAGNOSIS — K219 Gastro-esophageal reflux disease without esophagitis: Secondary | ICD-10-CM

## 2017-09-17 MED ORDER — SUCRALFATE 1 G PO TABS: 1.0000 g | ORAL_TABLET | Freq: Three times a day (TID) | ORAL | 2 refills | Status: DC

## 2017-09-24 ENCOUNTER — Ambulatory Visit (INDEPENDENT_AMBULATORY_CARE_PROVIDER_SITE_OTHER): Payer: BLUE CROSS/BLUE SHIELD | Admitting: Family Medicine

## 2017-09-24 ENCOUNTER — Encounter: Payer: Self-pay | Admitting: Family Medicine

## 2017-09-24 VITALS — BP 122/84 | HR 62 | Temp 97.7°F | Ht 68.0 in | Wt 167.2 lb

## 2017-09-24 DIAGNOSIS — Z23 Encounter for immunization: Secondary | ICD-10-CM

## 2017-09-24 DIAGNOSIS — F411 Generalized anxiety disorder: Secondary | ICD-10-CM | POA: Diagnosis not present

## 2017-09-24 DIAGNOSIS — H9209 Otalgia, unspecified ear: Secondary | ICD-10-CM | POA: Insufficient documentation

## 2017-09-24 DIAGNOSIS — H9201 Otalgia, right ear: Secondary | ICD-10-CM

## 2017-09-24 MED ORDER — CLONAZEPAM 0.5 MG PO TABS: ORAL_TABLET | ORAL | 2 refills | Status: DC

## 2017-09-24 MED ORDER — CELECOXIB 50 MG PO CAPS: 50.0000 mg | ORAL_CAPSULE | Freq: Two times a day (BID) | ORAL | 1 refills | Status: DC

## 2017-09-24 NOTE — Progress Notes (Signed)
Subjective:   Patient ID: Jesse Neal, male    DOB: [birth year], 52 y.o.   MRN: [id number]  Jesse Neal is a pleasant 52 y.o. year old male who presents to clinic today with Anxiety (Patient is here today to F/U with anxiety.  He takes Clonazepam 0.5mg  prn but when he needs it; it is effective.) and Otalgia (He is also C/O AD ache that has been intermittent x60yr.  He states that sometimes it gets really bad to where it keeps him up.  He has tried ear drops and antihistamines OTC but Sx persist.)  on 09/24/2017  HPI:  Anxiety- takes as needed Clonazepam. Does not use it regularly but he is asking for a refill of this today.  Right ear pain- intermittent for a year.  Sometimes keeps him up at night.  Has tried OTC ear drops and oral antihistamines without much improvement of symptoms.   Current Outpatient Medications on File Prior to Visit  Medication Sig Dispense Refill  . acetaminophen (TYLENOL) 325 MG tablet Take 650 mg by mouth every 6 (six) hours as needed.    . ranitidine (ZANTAC) 150 MG tablet Take 1 tablet at bedtime. 30 tablet 0  . sucralfate (CARAFATE) 1 g tablet Take 1 tablet (1 g total) by mouth 4 (four) times daily -  with meals and at bedtime. 30 tablet 2  . HYDROcodone-acetaminophen (NORCO/VICODIN) 5-325 MG tablet Take 1 tablet by mouth as needed for moderate pain. (Patient not taking: Reported on 07/24/2017) 20 tablet 0   No current facility-administered medications on file prior to visit.     Allergies  Allergen Reactions  . Bactrim [Sulfamethoxazole-Trimethoprim] Rash    Past Medical History:  Diagnosis Date  . Anxiety   . Dysrhythmia   . GERD (gastroesophageal reflux disease)   . SVT (supraventricular tachycardia) (HCC)    Dr. Gigi Gin    Past Surgical History:  Procedure Laterality Date  . BACK SURGERY    . COLONOSCOPY  2006  . COLONOSCOPY WITH PROPOFOL N/A 04/19/2017   Procedure: COLONOSCOPY WITH PROPOFOL;  Surgeon: Jonathon Bellows, MD;  Location: [facility]  ENDOSCOPY;  Service: Endoscopy;  Laterality: N/A;  . LUMBAR DISC SURGERY  12/2004   L4-5  . TONSILLECTOMY      Family History  Problem Relation Age of Onset  . Cancer Father 56       Prostate  . Hyperlipidemia Father   . Colon cancer Neg Hx     Social History   Socioeconomic History  . Marital status: Married    Spouse name: Not on file  . Number of children: Not on file  . Years of education: Not on file  . Highest education level: Not on file  Social Needs  . Financial resource strain: Not on file  . Food insecurity - worry: Not on file  . Food insecurity - inability: Not on file  . Transportation needs - medical: Not on file  . Transportation needs - non-medical: Not on file  Occupational History  . Occupation: Call center    Comment: [place]  Tobacco Use  . Smoking status: Never Smoker  . Smokeless tobacco: Never Used  Substance and Sexual Activity  . Alcohol use: Yes    Comment: once monthly  . Drug use: No  . Sexual activity: Yes  Other Topics Concern  . Not on file  Social History Narrative   Married, 2 children   The PMH, PSH, Social History, Family History, Medications, and allergies have  been reviewed in [facility], and have been updated if relevant.   Review of Systems  Constitutional: Negative.   HENT: Positive for ear pain. Negative for congestion, dental problem, drooling, ear discharge, facial swelling, hearing loss, mouth sores, nosebleeds, postnasal drip, rhinorrhea, sinus pressure, sinus pain, sneezing, sore throat, tinnitus, trouble swallowing and voice change.   Respiratory: Negative.   Cardiovascular: Negative.   Gastrointestinal: Negative.   Endocrine: Negative.   Genitourinary: Negative.   Musculoskeletal: Negative.   Allergic/Immunologic: Negative.   Neurological: Negative.   Hematological: Negative.   Psychiatric/Behavioral: Negative.   All other systems reviewed and are negative.      Objective:    BP 122/84 (BP Location: Left  Arm, Patient Position: Sitting, Cuff Size: Normal)   Pulse 62   Temp 97.7 F (36.5 C) (Oral)   Ht 5\' 8"  (1.727 m)   Wt 167 lb 3.2 oz (75.8 kg)   SpO2 97%   BMI 25.42 kg/m    Physical Exam  Constitutional: He is oriented to person, place, and time. He appears well-developed and well-nourished. No distress.  HENT:  Head: Normocephalic and atraumatic.  Right ear- cerumen is noted  Eyes: Conjunctivae are normal.  Cardiovascular: Normal rate.  Pulmonary/Chest: Effort normal.  Musculoskeletal: Normal range of motion.  Neurological: He is alert and oriented to person, place, and time. No cranial nerve deficit.  Skin: Skin is warm and dry. He is not diaphoretic.  Psychiatric: He has a normal mood and affect. His behavior is normal. Judgment and thought content normal.  Nursing note and vitals reviewed.         Assessment & Plan:   Need for Tdap vaccination - Plan: Tdap vaccine greater than or equal to 7yo IM  Generalized anxiety disorder No Follow-up on file.

## 2017-09-24 NOTE — Assessment & Plan Note (Signed)
Well controlled on as needed clonazepam. [place] controlled substances database reviewed- no red flags for pt. Rx refilled today.

## 2017-09-24 NOTE — Patient Instructions (Signed)
Great to see you. Happy New Year (and happy birthday).  Use dilute hydrogen peroxide (1:1 with water) a few drops in right ear to help break down ear wax.

## 2017-09-24 NOTE — Assessment & Plan Note (Signed)
Ceruminosis is noted.  Wax is removed by syringing and manual debridement. Instructions for home care to prevent wax buildup are given.  

## 2017-10-22 DIAGNOSIS — F321 Major depressive disorder, single episode, moderate: Secondary | ICD-10-CM | POA: Diagnosis not present

## 2017-11-12 DIAGNOSIS — F321 Major depressive disorder, single episode, moderate: Secondary | ICD-10-CM | POA: Diagnosis not present

## 2017-11-19 DIAGNOSIS — F321 Major depressive disorder, single episode, moderate: Secondary | ICD-10-CM | POA: Diagnosis not present

## 2017-11-26 DIAGNOSIS — F321 Major depressive disorder, single episode, moderate: Secondary | ICD-10-CM | POA: Diagnosis not present

## 2017-12-14 ENCOUNTER — Encounter: Payer: Self-pay | Admitting: Family Medicine

## 2017-12-14 ENCOUNTER — Ambulatory Visit (INDEPENDENT_AMBULATORY_CARE_PROVIDER_SITE_OTHER): Payer: BLUE CROSS/BLUE SHIELD | Admitting: Family Medicine

## 2017-12-14 VITALS — BP 102/70 | HR 60

## 2017-12-14 DIAGNOSIS — H6690 Otitis media, unspecified, unspecified ear: Secondary | ICD-10-CM | POA: Insufficient documentation

## 2017-12-14 DIAGNOSIS — B9689 Other specified bacterial agents as the cause of diseases classified elsewhere: Secondary | ICD-10-CM | POA: Diagnosis not present

## 2017-12-14 DIAGNOSIS — J019 Acute sinusitis, unspecified: Secondary | ICD-10-CM

## 2017-12-14 DIAGNOSIS — H66001 Acute suppurative otitis media without spontaneous rupture of ear drum, right ear: Secondary | ICD-10-CM | POA: Diagnosis not present

## 2017-12-14 MED ORDER — AMOXICILLIN 500 MG PO CAPS: 500.0000 mg | ORAL_CAPSULE | Freq: Two times a day (BID) | ORAL | 0 refills | Status: DC

## 2017-12-14 MED ORDER — AMOXICILLIN-POT CLAVULANATE 875-125 MG PO TABS: 1.0000 | ORAL_TABLET | Freq: Two times a day (BID) | ORAL | 0 refills | Status: DC

## 2017-12-14 NOTE — Assessment & Plan Note (Signed)
See Acute sinusitis

## 2017-12-14 NOTE — Assessment & Plan Note (Signed)
Rx for augmentin, increased fluid intake, recommend tylenol for pain control as he has GI upset with NSAIDS. Likely has an allergy component contributing to etiology as well, recommend starting OTC antihistamine in addition to nasacort, especially during peak of pollen season.   He will follow up if not improving.

## 2017-12-14 NOTE — Patient Instructions (Signed)
Sinus Headache A sinus headache happens when your sinuses become clogged or swollen. You may feel pain or pressure in your face, forehead, ears, or upper teeth. Sinus headaches can be mild or severe. Follow these instructions at home:  Take medicines only as told by your doctor.  If you were given an antibiotic medicine, finish all of it even if you start to feel better.  Use a nose spray if you feel stuffed up (congested).  If told, apply a warm, moist washcloth to your face to help lessen pain. Contact a doctor if:  You get headaches more than one time each week.  Light or sound bothers you.  You have a fever.  You feel sick to your stomach (nauseous) or you throw up (vomit).  Your headaches do not get better with treatment. Get help right away if:  You have trouble seeing.  You suddenly have very bad pain in your face or head.  You start to twitch or shake (seizure).  You are confused.  You have a stiff neck. This information is not intended to replace advice given to you by your health care provider. Make sure you discuss any questions you have with your health care provider. Document Released: 12/28/2010 Document Revised: 04/23/2016 Document Reviewed: 08/24/2014 Elsevier Interactive Patient Education  2018 Elsevier Inc.  

## 2017-12-14 NOTE — Telephone Encounter (Signed)
I called pt and scheduled appt with Dr. Zigmund Daniel today at 1pm/I advised him that since he has had this headache for several days he can cancel Monday's appt with Dr. Deborra Medina after he sees Dr. Zigmund Daniel in case he feels that pt needs to return on Monday he will still have it on the books/thx dmf

## 2017-12-14 NOTE — Progress Notes (Signed)
   Subjective:    Patient ID: Jesse Neal, male    DOB: [birth year], 52 y.o.   MRN: [id number]  Sinusitis  This is a new problem. The current episode started in the past 7 days (began after mowing grass last Saturday. ). The problem has been gradually worsening since onset. There has been no fever. The pain is moderate. Associated symptoms include congestion, ear pain (right ear pain), headaches (Stabbing pain in R ear area at times with throbbing pain in frontal and posterior area at other times) and sinus pressure. Pertinent negatives include no chills, coughing, neck pain, shortness of breath, sneezing, sore throat or swollen glands. (Pain in R upper teeth ) Past treatments include acetaminophen (ibuprofen). The treatment provided moderate relief.  He does also have a history of seasonal allergies and uses nasacort.    Review of Systems  Constitutional: Negative for chills.  HENT: Positive for congestion, ear pain (right ear pain) and sinus pressure. Negative for sneezing and sore throat.   Respiratory: Negative for cough and shortness of breath.   Musculoskeletal: Negative for neck pain.  Neurological: Positive for headaches (Stabbing pain in R ear area at times with throbbing pain in frontal and posterior area at other times).   Past Medical History:  Diagnosis Date  . Anxiety   . Dysrhythmia   . GERD (gastroesophageal reflux disease)   . SVT (supraventricular tachycardia) (HCC)    Dr. Gigi Gin       Objective:   Physical Exam  Constitutional: He is oriented to person, place, and time. He appears well-nourished. No distress.  HENT:  Head: Normocephalic and atraumatic.  Right Ear: Tympanic membrane is injected. A middle ear effusion (small amount of blood posterior to R TM.  ) is present.  Nose: Mucosal edema present. Right sinus exhibits maxillary sinus tenderness. Right sinus exhibits no frontal sinus tenderness. Left sinus exhibits no frontal sinus tenderness.    Mouth/Throat: Oropharynx is clear and moist.  Eyes: Pupils are equal, round, and reactive to light. Conjunctivae and EOM are normal. No scleral icterus.  Neck: Neck supple.  Cardiovascular: Normal rate, regular rhythm and normal heart sounds.  Pulmonary/Chest: Effort normal and breath sounds normal. No respiratory distress.  Lymphadenopathy:    He has no cervical adenopathy.  Neurological: He is alert and oriented to person, place, and time. No cranial nerve deficit. Coordination normal.  Skin: No rash noted.  Psychiatric: He has a normal mood and affect. His behavior is normal.          Assessment & Plan:  Otitis media See Acute sinusitis  Acute bacterial sinusitis Rx for augmentin, increased fluid intake, recommend tylenol for pain control as he has GI upset with NSAIDS. Likely has an allergy component contributing to etiology as well, recommend starting OTC antihistamine in addition to nasacort, especially during peak of pollen season.   He will follow up if not improving.

## 2017-12-17 ENCOUNTER — Encounter: Payer: Self-pay | Admitting: Family Medicine

## 2017-12-17 ENCOUNTER — Ambulatory Visit: Payer: BLUE CROSS/BLUE SHIELD | Admitting: Family Medicine

## 2017-12-17 ENCOUNTER — Other Ambulatory Visit: Payer: Self-pay | Admitting: Family Medicine

## 2017-12-17 DIAGNOSIS — J302 Other seasonal allergic rhinitis: Secondary | ICD-10-CM

## 2017-12-24 DIAGNOSIS — F321 Major depressive disorder, single episode, moderate: Secondary | ICD-10-CM | POA: Diagnosis not present

## 2018-01-02 ENCOUNTER — Encounter: Payer: Self-pay | Admitting: Family Medicine

## 2018-01-07 DIAGNOSIS — F321 Major depressive disorder, single episode, moderate: Secondary | ICD-10-CM | POA: Diagnosis not present

## 2018-01-14 DIAGNOSIS — H1045 Other chronic allergic conjunctivitis: Secondary | ICD-10-CM | POA: Diagnosis not present

## 2018-01-14 DIAGNOSIS — J309 Allergic rhinitis, unspecified: Secondary | ICD-10-CM | POA: Diagnosis not present

## 2018-01-21 DIAGNOSIS — F321 Major depressive disorder, single episode, moderate: Secondary | ICD-10-CM | POA: Diagnosis not present

## 2018-02-14 ENCOUNTER — Encounter: Payer: Self-pay | Admitting: Family Medicine

## 2018-02-14 ENCOUNTER — Ambulatory Visit (INDEPENDENT_AMBULATORY_CARE_PROVIDER_SITE_OTHER): Payer: BLUE CROSS/BLUE SHIELD | Admitting: Family Medicine

## 2018-02-14 DIAGNOSIS — M5416 Radiculopathy, lumbar region: Secondary | ICD-10-CM | POA: Diagnosis not present

## 2018-02-14 MED ORDER — HYDROCODONE-ACETAMINOPHEN 5-325 MG PO TABS: 1.0000 | ORAL_TABLET | Freq: Two times a day (BID) | ORAL | 0 refills | Status: DC | PRN

## 2018-02-14 MED ORDER — PREDNISONE 20 MG PO TABS: ORAL_TABLET | ORAL | 0 refills | Status: DC

## 2018-02-14 MED ORDER — CYCLOBENZAPRINE HCL 10 MG PO TABS: 10.0000 mg | ORAL_TABLET | Freq: Every evening | ORAL | 0 refills | Status: DC | PRN

## 2018-02-14 NOTE — Assessment & Plan Note (Signed)
Treat with heat, massage, start home  PT.  Treat with prednsione taper and muscle relaxant.  Small amount hydrocodone given for  Breakthrough pain... Discussed that this is not a long-term med.  If not improving in 2 weeks, follow up for consideration of x-ray and possible PT.

## 2018-02-14 NOTE — Patient Instructions (Signed)
Start prednisone taper. Flexeril as needed for muscle spasm at night.  Can use hydrocodone for breakthrough pain.  Use heat, start home exercises, massage.

## 2018-02-14 NOTE — Progress Notes (Signed)
   Subjective:    Patient ID: Jesse Neal, male    DOB: March 01, 1966, 52 y.o.   MRN: [id number]  HPI  52 year old male pt of Dr. Hulen Shouts with history of PSVT, OA, GAD and low testosterone presents with new onset back pain x 4 days.  he reports after playing pickle ball.. Leaned to left.. Sudden pain left lower back, radiates to left hip and  To lower leg to knee.  kept playing... Afterward pain was even worse. No new numbness, no weakness, no fever, no incontinence, no perineal numbness.  Has been treating with ice and heat, using muscle relaxant off and on.  Tylenol helps minimally.  TENS unit off an on.  NSAIDs contraindicated given stomach upset with it.  ( SE to celebrex, ibuprofen in past)   No improvement in pain. Sitting and standing hurts the worst.. Lying flat in bed helps the pain.   HX:  2006 disc rupture.. Surgery   Has had chronic  Right back pain off and on. Has used muscle relaxant and hydrocodone prn.. Has none.  Blood pressure 106/78, pulse 67, temperature 98.1 F (36.7 C), temperature source Oral, height 5\' 8"  (1.727 m), weight 169 lb 8 oz (76.9 kg), SpO2 97 %.   Review of Systems  Constitutional: Negative for fatigue and fever.  HENT: Negative for ear pain.   Eyes: Negative for pain.  Respiratory: Negative for cough and shortness of breath.   Cardiovascular: Negative for chest pain, palpitations and leg swelling.  Gastrointestinal: Negative for abdominal pain.  Genitourinary: Negative for dysuria.  Musculoskeletal: Positive for back pain and gait problem. Negative for arthralgias.  Neurological: Negative for syncope, light-headedness and headaches.  Psychiatric/Behavioral: Negative for dysphoric mood.       Objective:   Physical Exam  Constitutional: Vital signs are normal. He appears well-developed and well-nourished.  HENT:  Head: Normocephalic.  Right Ear: Hearing normal.  Left Ear: Hearing normal.  Nose: Nose normal.  Mouth/Throat: Oropharynx  is clear and moist and mucous membranes are normal.  Neck: Trachea normal. Carotid bruit is not present. No thyroid mass and no thyromegaly present.  Cardiovascular: Normal rate, regular rhythm and normal pulses. Exam reveals no gallop, no distant heart sounds and no friction rub.  No murmur heard. No peripheral edema  Pulmonary/Chest: Effort normal and breath sounds normal. No respiratory distress.  Musculoskeletal:       Lumbar back: He exhibits decreased range of motion and tenderness. He exhibits no bony tenderness.  ttp in left lower back   Neurological: He has normal strength. No cranial nerve deficit or sensory deficit. He displays a negative Romberg sign. Gait abnormal.   Positive SLR on left, pain to mid thigh  Skin: Skin is warm, dry and intact. No rash noted.  Psychiatric: He has a normal mood and affect. His speech is normal and behavior is normal. Thought content normal.          Assessment & Plan:

## 2018-02-18 ENCOUNTER — Ambulatory Visit: Payer: BLUE CROSS/BLUE SHIELD | Admitting: Family Medicine

## 2018-02-25 ENCOUNTER — Ambulatory Visit (INDEPENDENT_AMBULATORY_CARE_PROVIDER_SITE_OTHER): Payer: BLUE CROSS/BLUE SHIELD | Admitting: Family Medicine

## 2018-02-25 ENCOUNTER — Encounter: Payer: Self-pay | Admitting: Family Medicine

## 2018-02-25 VITALS — BP 116/80 | HR 70 | Temp 97.9°F | Ht 68.0 in | Wt 170.4 lb

## 2018-02-25 DIAGNOSIS — K6289 Other specified diseases of anus and rectum: Secondary | ICD-10-CM

## 2018-02-25 DIAGNOSIS — F321 Major depressive disorder, single episode, moderate: Secondary | ICD-10-CM | POA: Diagnosis not present

## 2018-02-25 DIAGNOSIS — J019 Acute sinusitis, unspecified: Secondary | ICD-10-CM | POA: Diagnosis not present

## 2018-02-25 MED ORDER — AMOXICILLIN-POT CLAVULANATE 875-125 MG PO TABS: 1.0000 | ORAL_TABLET | Freq: Two times a day (BID) | ORAL | 0 refills | Status: AC

## 2018-02-25 MED ORDER — MONTELUKAST SODIUM 10 MG PO TABS: 10.0000 mg | ORAL_TABLET | Freq: Every day | ORAL | 99 refills | Status: DC

## 2018-02-25 NOTE — Assessment & Plan Note (Signed)
Given duration and progression of symptoms, will treat for bacterial sinusitis with Augmentin.  Restart Singulair- eRx sent. Call or return to clinic prn if these symptoms worsen or fail to improve as anticipated. The patient indicates understanding of these issues and agrees with the plan.

## 2018-02-25 NOTE — Patient Instructions (Signed)
Take augmentin as directed- 1 tablet twice daily for 10 days.  Good luck at the games!

## 2018-02-25 NOTE — Assessment & Plan Note (Signed)
?   Small sebaceous cysts from long runs in the heat. Advised changing and taking baths right after his runs. Call or return to clinic prn if these symptoms worsen or fail to improve as anticipated. The patient indicates understanding of these issues and agrees with the plan.

## 2018-02-25 NOTE — Progress Notes (Signed)
Subjective:   Patient ID: Jesse Neal, male    DOB: 09/29/1965, 52 y.o.   MRN: [id number]  Jesse Neal is a pleasant 52 y.o. year old male who presents to clinic today with Sinusitis (Patient is here today C/O a possible sinus infection.  He states that he has pressure in the back of his head, maxillary and periorbital and states that his teeth hurt.  Ears have been popping.   Started Sunday really hard.  Feels nauseated from post-nasal drip.  Denies any cough. Tx with Xyzal, Astelin NS, and eye gtt but Sx persist.  He ran out of Singulair a week ago.) and Hemorrhoids (He is also C/O a possible hemorrhoids.  States that there is like 3 little bumps and when he runs they get bigger.  Has been using witch hazel and cortisone cream but Sx persist.)  on 02/25/2018  HPI:  Patient is here today C/O a possible sinus infection. He states that he has pressure in the back of his head, maxillary and periorbital and states that his teeth hurt. Ears have been popping. Started Sunday really hard. Feels nauseated from post-nasal drip. Denies any cough. Tx with Xyzal, Astelin NS, and eye gtt but Sx persist. He ran out of Singulair a week ago.  He is also C/O a possible hemorrhoids. States that there is like 3 little bumps and when he runs they get bigger and painful. Has been using witch hazel and cortisone cream which helps but Sx persist.  He is training for a race this weekend so he has been running quite a bit and sweating.  No bleeding from rectum or blood in his stool.  No changes in bowel habits. Normal colonoscopy in 04/2017- reviewed.  Current Outpatient Medications on File Prior to Visit  Medication Sig Dispense Refill  . acetaminophen (TYLENOL) 325 MG tablet Take 650 mg by mouth every 6 (six) hours as needed.    Marland Kitchen azelastine (ASTELIN) 0.1 % nasal spray SPRAY 1-2 SPRAYS IN EACH NOSTRIL TWICE A DAY  3  . azelastine (OPTIVAR) 0.05 % ophthalmic solution INSTILL 1 DROP INTO AFFECTED EYE TWICE A DAY   3  . clonazePAM (KLONOPIN) 0.5 MG tablet Take one half to one tablet by mouth daily 30 tablet 2  . cyclobenzaprine (FLEXERIL) 10 MG tablet Take 1 tablet (10 mg total) by mouth at bedtime as needed for muscle spasms. 15 tablet 0  . HYDROcodone-acetaminophen (NORCO/VICODIN) 5-325 MG tablet Take 1 tablet by mouth 2 (two) times daily as needed for moderate pain. 10 tablet 0  . ranitidine (ZANTAC) 150 MG tablet Take 1 tablet at bedtime. 30 tablet 0   No current facility-administered medications on file prior to visit.     Allergies  Allergen Reactions  . Bactrim [Sulfamethoxazole-Trimethoprim] Rash    Past Medical History:  Diagnosis Date  . Anxiety   . Dysrhythmia   . GERD (gastroesophageal reflux disease)   . SVT (supraventricular tachycardia) (HCC)    Dr. Gigi Gin    Past Surgical History:  Procedure Laterality Date  . BACK SURGERY    . COLONOSCOPY  2006  . COLONOSCOPY WITH PROPOFOL N/A 04/19/2017   Procedure: COLONOSCOPY WITH PROPOFOL;  Surgeon: Jonathon Bellows, MD;  Location: [facility] ENDOSCOPY;  Service: Endoscopy;  Laterality: N/A;  . LUMBAR DISC SURGERY  12/2004   L4-5  . TONSILLECTOMY      Family History  Problem Relation Age of Onset  . Cancer Father 77       Prostate  .  Hyperlipidemia Father   . Colon cancer Neg Hx     Social History   Socioeconomic History  . Marital status: Married    Spouse name: Not on file  . Number of children: Not on file  . Years of education: Not on file  . Highest education level: Not on file  Occupational History  . Occupation: Call center    Comment: [place]  . Financial resource strain: Not on file  . Food insecurity:    Worry: Not on file    Inability: Not on file  . Transportation needs:    Medical: Not on file    Non-medical: Not on file  Tobacco Use  . Smoking status: Never Smoker  . Smokeless tobacco: Never Used  Substance and Sexual Activity  . Alcohol use: Yes    Comment: once monthly  . Drug use: No   . Sexual activity: Yes  Lifestyle  . Physical activity:    Days per week: Not on file    Minutes per session: Not on file  . Stress: Not on file  Relationships  . Social connections:    Talks on phone: Not on file    Gets together: Not on file    Attends religious service: Not on file    Active member of club or organization: Not on file    Attends meetings of clubs or organizations: Not on file    Relationship status: Not on file  . Intimate partner violence:    Fear of current or ex partner: Not on file    Emotionally abused: Not on file    Physically abused: Not on file    Forced sexual activity: Not on file  Other Topics Concern  . Not on file  Social History Narrative   Married, 2 children   The PMH, PSH, Social History, Family History, Medications, and allergies have been reviewed in [facility], and have been updated if relevant.  Review of Systems  Constitutional: Positive for fatigue. Negative for fever.  HENT: Positive for congestion, sinus pressure and sinus pain. Negative for trouble swallowing and voice change.   Respiratory: Negative.   Cardiovascular: Negative.   Gastrointestinal: Positive for rectal pain. Negative for abdominal pain, blood in stool, constipation and diarrhea.  Endocrine: Negative.   Genitourinary: Negative.   Musculoskeletal: Negative.   Allergic/Immunologic: Negative.   Neurological: Negative.   Hematological: Negative.   Psychiatric/Behavioral: Negative.   All other systems reviewed and are negative.      Objective:    BP 116/80 (BP Location: Left Arm, Patient Position: Sitting, Cuff Size: Normal)   Pulse 70   Temp 97.9 F (36.6 C) (Oral)   Ht 5\' 8"  (1.727 m)   Wt 170 lb 6.4 oz (77.3 kg)   SpO2 97%   BMI 25.91 kg/m    Physical Exam  Constitutional: He is oriented to person, place, and time. He appears well-developed and well-nourished. No distress.  HENT:  Head: Normocephalic and atraumatic.  Right Ear: Hearing, tympanic  membrane and external ear normal.  Left Ear: Hearing, tympanic membrane and external ear normal.  Nose: Mucosal edema present. Right sinus exhibits maxillary sinus tenderness. Left sinus exhibits maxillary sinus tenderness.  Cardiovascular: Normal rate and regular rhythm.  Pulmonary/Chest: Effort normal and breath sounds normal.  Abdominal: Soft. Bowel sounds are normal. He exhibits no distension. There is no tenderness.  Genitourinary: Rectal exam shows no external hemorrhoid, no internal hemorrhoid, no fissure, no mass, anal tone normal and  guaiac negative stool.  Genitourinary Comments: 3 very small raised irritated papules around rectum  Neurological: He is alert and oriented to person, place, and time. No cranial nerve deficit.  Skin: Skin is warm and dry. He is not diaphoretic.  Psychiatric: He has a normal mood and affect. His behavior is normal. Judgment and thought content normal.  Nursing note and vitals reviewed.         Assessment & Plan:   No diagnosis found. No follow-ups on file.

## 2018-05-15 ENCOUNTER — Encounter: Payer: Self-pay | Admitting: Family Medicine

## 2018-05-16 ENCOUNTER — Other Ambulatory Visit: Payer: Self-pay

## 2018-05-16 DIAGNOSIS — Z1159 Encounter for screening for other viral diseases: Secondary | ICD-10-CM

## 2018-05-16 DIAGNOSIS — Z1211 Encounter for screening for malignant neoplasm of colon: Secondary | ICD-10-CM

## 2018-05-16 DIAGNOSIS — R7989 Other specified abnormal findings of blood chemistry: Secondary | ICD-10-CM

## 2018-05-16 DIAGNOSIS — Z13220 Encounter for screening for lipoid disorders: Secondary | ICD-10-CM

## 2018-05-16 DIAGNOSIS — Z0000 Encounter for general adult medical examination without abnormal findings: Secondary | ICD-10-CM

## 2018-05-22 ENCOUNTER — Other Ambulatory Visit (INDEPENDENT_AMBULATORY_CARE_PROVIDER_SITE_OTHER): Payer: BLUE CROSS/BLUE SHIELD

## 2018-05-22 DIAGNOSIS — Z0000 Encounter for general adult medical examination without abnormal findings: Secondary | ICD-10-CM | POA: Diagnosis not present

## 2018-05-22 DIAGNOSIS — Z1211 Encounter for screening for malignant neoplasm of colon: Secondary | ICD-10-CM

## 2018-05-22 DIAGNOSIS — Z1159 Encounter for screening for other viral diseases: Secondary | ICD-10-CM

## 2018-05-22 DIAGNOSIS — R7989 Other specified abnormal findings of blood chemistry: Secondary | ICD-10-CM

## 2018-05-22 DIAGNOSIS — Z13220 Encounter for screening for lipoid disorders: Secondary | ICD-10-CM

## 2018-05-22 LAB — COMPREHENSIVE METABOLIC PANEL
ALT: 16 U/L (ref 0–53)
AST: 17 U/L (ref 0–37)
Albumin: 4.6 g/dL (ref 3.5–5.2)
Alkaline Phosphatase: 46 U/L (ref 39–117)
BUN: 11 mg/dL (ref 6–23)
CO2: 31 mEq/L (ref 19–32)
Calcium: 9.5 mg/dL (ref 8.4–10.5)
Chloride: 104 mEq/L (ref 96–112)
Creatinine, Ser: 0.98 mg/dL (ref 0.40–1.50)
GFR: 84.79 mL/min (ref >60.00)
Glucose, Bld: 97 mg/dL (ref 70–99)
Potassium: 4.3 mEq/L (ref 3.5–5.1)
Sodium: 140 mEq/L (ref 135–145)
Total Bilirubin: 0.7 mg/dL (ref 0.2–1.2)
Total Protein: 6.6 g/dL (ref 6.0–8.3)

## 2018-05-22 LAB — CBC WITH DIFFERENTIAL/PLATELET
Basophils Absolute: 0 10*3/uL (ref 0.0–0.1)
Basophils Relative: 0.8 % (ref 0.0–3.0)
Eosinophils Absolute: 0.1 10*3/uL (ref 0.0–0.7)
Eosinophils Relative: 1.1 % (ref 0.0–5.0)
HCT: 43.4 % (ref 39.0–52.0)
Hemoglobin: 14.9 g/dL (ref 13.0–17.0)
Lymphocytes Relative: 39.6 % (ref 12.0–46.0)
Lymphs Abs: 1.8 10*3/uL (ref 0.7–4.0)
MCHC: 34.3 g/dL (ref 30.0–36.0)
MCV: 90.5 fl (ref 78.0–100.0)
Monocytes Absolute: 0.4 10*3/uL (ref 0.1–1.0)
Monocytes Relative: 8.2 % (ref 3.0–12.0)
Neutro Abs: 2.3 10*3/uL (ref 1.4–7.7)
Neutrophils Relative %: 50.3 % (ref 43.0–77.0)
Platelets: 194 10*3/uL (ref 150.0–400.0)
RBC: 4.79 Mil/uL (ref 4.22–5.81)
RDW: 12.9 % (ref 11.5–15.5)
WBC: 4.6 10*3/uL (ref 4.0–10.5)

## 2018-05-22 LAB — LIPID PANEL
Cholesterol: 142 mg/dL (ref 0–200)
HDL: 46.8 mg/dL (ref >39.00)
LDL Cholesterol: 80 mg/dL (ref 0–99)
NonHDL: 95.31
Total CHOL/HDL Ratio: 3
Triglycerides: 77 mg/dL (ref 0.0–149.0)
VLDL: 15.4 mg/dL (ref 0.0–40.0)

## 2018-05-22 LAB — PSA: PSA: 0.59 ng/mL (ref 0.10–4.00)

## 2018-05-23 ENCOUNTER — Other Ambulatory Visit: Payer: BLUE CROSS/BLUE SHIELD

## 2018-05-26 LAB — HEPATITIS C ANTIBODY
Hepatitis C Ab: NONREACTIVE
SIGNAL TO CUT-OFF: 0.01 (ref <1.00)

## 2018-05-26 LAB — TESTOSTERONE, FREE & TOTAL
Free Testosterone: 44.2 pg/mL (ref 35.0–155.0)
Testosterone, Total, LC-MS-MS: 378 ng/dL (ref 250–1100)

## 2018-05-28 ENCOUNTER — Ambulatory Visit: Payer: BLUE CROSS/BLUE SHIELD | Admitting: Family Medicine

## 2018-06-11 ENCOUNTER — Encounter: Payer: Self-pay | Admitting: Family Medicine

## 2018-06-11 ENCOUNTER — Ambulatory Visit (INDEPENDENT_AMBULATORY_CARE_PROVIDER_SITE_OTHER): Payer: BLUE CROSS/BLUE SHIELD | Admitting: Family Medicine

## 2018-06-11 VITALS — BP 118/72 | HR 67 | Temp 98.1°F | Ht 68.25 in | Wt 173.4 lb

## 2018-06-11 DIAGNOSIS — J302 Other seasonal allergic rhinitis: Secondary | ICD-10-CM | POA: Diagnosis not present

## 2018-06-11 DIAGNOSIS — F411 Generalized anxiety disorder: Secondary | ICD-10-CM | POA: Diagnosis not present

## 2018-06-11 DIAGNOSIS — L918 Other hypertrophic disorders of the skin: Secondary | ICD-10-CM | POA: Insufficient documentation

## 2018-06-11 DIAGNOSIS — Z0000 Encounter for general adult medical examination without abnormal findings: Secondary | ICD-10-CM

## 2018-06-11 DIAGNOSIS — Z0001 Encounter for general adult medical examination with abnormal findings: Secondary | ICD-10-CM

## 2018-06-11 MED ORDER — AZELASTINE HCL 0.1 % NA SOLN: NASAL | 99 refills | Status: DC

## 2018-06-11 MED ORDER — CLONAZEPAM 0.5 MG PO TABS: ORAL_TABLET | ORAL | 1 refills | Status: DC

## 2018-06-11 MED ORDER — MONTELUKAST SODIUM 10 MG PO TABS: 10.0000 mg | ORAL_TABLET | Freq: Every day | ORAL | 99 refills | Status: DC

## 2018-06-11 NOTE — Progress Notes (Signed)
Subjective:   Patient ID: Jesse Neal, male    DOB: 10-13-1965, 52 y.o.   MRN: [id number]  Jesse Neal is a pleasant 52 y.o. year old male who presents to clinic today with Annual Exam (Patient is here today for a wellness.  Fasting labs completed on 9.11.19.  He declines flu shot as he will get it for free from work next week. )  on 06/11/2018  HPI:  Doing well- training for several races- a 5 K and a 10 K. Denies any CP or SOB when he runs.    Health Maintenance  Topic Date Due  . INFLUENZA VACCINE  06/18/2018 (Originally 04/11/2018)  . COLONOSCOPY  04/20/2027  . TETANUS/TDAP  09/25/2027  . Hepatitis C Screening  Completed  . HIV Screening  Discontinued   Lab Results  Component Value Date   CHOL 142 05/22/2018   HDL 46.80 05/22/2018   LDLCALC 80 05/22/2018   LDLDIRECT 103.0 01/24/2016   TRIG 77.0 05/22/2018   CHOLHDL 3 05/22/2018   Lab Results  Component Value Date   WBC 4.6 05/22/2018   HGB 14.9 05/22/2018   HCT 43.4 05/22/2018   MCV 90.5 05/22/2018   PLT 194.0 05/22/2018   .Anxiety- takes as needed Clonazepam. Does not use it regularly but he is asking for a refill of this today.  Allergic rhinitis- has seen an allergist.  Current rxs seem to be working best.  He does have a skin tag that is irritating him under his right axilla.   Current Outpatient Medications on File Prior to Visit  Medication Sig Dispense Refill  . acetaminophen (TYLENOL) 325 MG tablet Take 650 mg by mouth every 6 (six) hours as needed.    Marland Kitchen azelastine (OPTIVAR) 0.05 % ophthalmic solution INSTILL 1 DROP INTO AFFECTED EYE TWICE A DAY  3  . cyclobenzaprine (FLEXERIL) 10 MG tablet Take 1 tablet (10 mg total) by mouth at bedtime as needed for muscle spasms. 15 tablet 0  . HYDROcodone-acetaminophen (NORCO/VICODIN) 5-325 MG tablet Take 1 tablet by mouth 2 (two) times daily as needed for moderate pain. 10 tablet 0  . ranitidine (ZANTAC) 150 MG tablet Take 1 tablet at bedtime. 30 tablet 0    No current facility-administered medications on file prior to visit.     Allergies  Allergen Reactions  . Bactrim [Sulfamethoxazole-Trimethoprim] Rash    Past Medical History:  Diagnosis Date  . Anxiety   . Dysrhythmia   . GERD (gastroesophageal reflux disease)   . SVT (supraventricular tachycardia) (HCC)    Dr. Gigi Gin    Past Surgical History:  Procedure Laterality Date  . BACK SURGERY    . COLONOSCOPY  2006  . COLONOSCOPY WITH PROPOFOL N/A 04/19/2017   Procedure: COLONOSCOPY WITH PROPOFOL;  Surgeon: Jonathon Bellows, MD;  Location: [facility] ENDOSCOPY;  Service: Endoscopy;  Laterality: N/A;  . LUMBAR DISC SURGERY  12/2004   L4-5  . TONSILLECTOMY      Family History  Problem Relation Age of Onset  . Cancer Father 31       Prostate  . Hyperlipidemia Father   . Colon cancer Neg Hx     Social History   Socioeconomic History  . Marital status: Married    Spouse name: Not on file  . Number of children: Not on file  . Years of education: Not on file  . Highest education level: Not on file  Occupational History  . Occupation: Call center    Comment: [employer]  Needs  . Financial resource strain: Not on file  . Food insecurity:    Worry: Not on file    Inability: Not on file  . Transportation needs:    Medical: Not on file    Non-medical: Not on file  Tobacco Use  . Smoking status: Never Smoker  . Smokeless tobacco: Never Used  Substance and Sexual Activity  . Alcohol use: Yes    Comment: once monthly  . Drug use: No  . Sexual activity: Yes  Lifestyle  . Physical activity:    Days per week: Not on file    Minutes per session: Not on file  . Stress: Not on file  Relationships  . Social connections:    Talks on phone: Not on file    Gets together: Not on file    Attends religious service: Not on file    Active member of club or organization: Not on file    Attends meetings of clubs or organizations: Not on file    Relationship status: Not on file   . Intimate partner violence:    Fear of current or ex partner: Not on file    Emotionally abused: Not on file    Physically abused: Not on file    Forced sexual activity: Not on file  Other Topics Concern  . Not on file  Social History Narrative   Married, 2 children   The PMH, PSH, Social History, Family History, Medications, and allergies have been reviewed in [facility], and have been updated if relevant.  Review of Systems  Constitutional: Negative.   HENT: Negative.   Respiratory: Negative.   Cardiovascular: Negative.   Gastrointestinal: Negative.   Endocrine: Negative.   Genitourinary: Negative.   Musculoskeletal: Negative.   Skin:       + skin tag  Allergic/Immunologic: Negative.   Neurological: Negative.   Hematological: Negative.   Psychiatric/Behavioral: Negative.   All other systems reviewed and are negative.      Objective:    BP 118/72 (BP Location: Left Arm, Patient Position: Sitting, Cuff Size: Normal)   Pulse 67   Temp 98.1 F (36.7 C) (Oral)   Ht 5' 8.25" (1.734 m)   Wt 173 lb 6.4 oz (78.7 kg)   SpO2 98%   BMI 26.17 kg/m   Wt Readings from Last 3 Encounters:  06/11/18 173 lb 6.4 oz (78.7 kg)  02/25/18 170 lb 6.4 oz (77.3 kg)  02/14/18 169 lb 8 oz (76.9 kg)     Physical Exam  Constitutional: He is oriented to person, place, and time. He appears well-developed and well-nourished. No distress.  HENT:  Head: Normocephalic and atraumatic.  Eyes: EOM are normal.  Neck: Normal range of motion.  Cardiovascular: Normal rate and regular rhythm.  Pulmonary/Chest: Effort normal and breath sounds normal.  Musculoskeletal: Normal range of motion. He exhibits no edema.  Neurological: He is alert and oriented to person, place, and time. No cranial nerve deficit.  Skin: He is not diaphoretic.  Skin tag under right axilla, non erythematous or painful with manipulation  Psychiatric: He has a normal mood and affect. His behavior is normal. Judgment and thought  content normal.  Nursing note and vitals reviewed.         Assessment & Plan:   Visit for well man health check No follow-ups on file.

## 2018-06-11 NOTE — Assessment & Plan Note (Signed)
Reviewed preventive care protocols, scheduled due services, and updated immunizations Discussed nutrition, exercise, diet, and healthy lifestyle.  

## 2018-06-11 NOTE — Assessment & Plan Note (Signed)
No changes made to rx- he uses Klonipin sparingly.

## 2018-06-11 NOTE — Assessment & Plan Note (Signed)
He feels his symptoms are relatively well controlled with current rxs. eRx refills sent.

## 2018-06-11 NOTE — Assessment & Plan Note (Signed)
New- Using sterile iris scissors,  skin tag was snipped off at the bases after cleansing with Betadine.  Bleeding was controlled by pressure.    Pathognomonic benign lesions  not sent for pathological exam.

## 2018-10-04 DIAGNOSIS — R51 Headache: Secondary | ICD-10-CM | POA: Diagnosis not present

## 2018-10-04 DIAGNOSIS — J301 Allergic rhinitis due to pollen: Secondary | ICD-10-CM | POA: Diagnosis not present

## 2019-01-15 ENCOUNTER — Encounter: Payer: Self-pay | Admitting: Family Medicine

## 2019-01-16 ENCOUNTER — Ambulatory Visit (INDEPENDENT_AMBULATORY_CARE_PROVIDER_SITE_OTHER): Payer: BLUE CROSS/BLUE SHIELD | Admitting: Family Medicine

## 2019-01-16 ENCOUNTER — Encounter: Payer: Self-pay | Admitting: Family Medicine

## 2019-01-16 VITALS — Temp 97.6°F | Wt 169.7 lb

## 2019-01-16 DIAGNOSIS — M5416 Radiculopathy, lumbar region: Secondary | ICD-10-CM | POA: Diagnosis not present

## 2019-01-16 MED ORDER — MONTELUKAST SODIUM 10 MG PO TABS: 10.0000 mg | ORAL_TABLET | Freq: Every day | ORAL | 3 refills | Status: DC

## 2019-01-16 MED ORDER — CYCLOBENZAPRINE HCL 10 MG PO TABS: 10.0000 mg | ORAL_TABLET | Freq: Two times a day (BID) | ORAL | 0 refills | Status: DC | PRN

## 2019-01-16 MED ORDER — HYDROCODONE-ACETAMINOPHEN 5-325 MG PO TABS: 1.0000 | ORAL_TABLET | Freq: Two times a day (BID) | ORAL | 0 refills | Status: DC | PRN

## 2019-01-16 NOTE — Assessment & Plan Note (Signed)
H/o chronic DDD of lumbar spine with radiculopathy that occurs a couple of times per year.  He is very active and compliant.  Agree with short course of muscle relaxants and vicodin (to use very sparingly).  Advised to remain as active as possible. Call or send my chart message prn if these symptoms worsen or fail to improve as anticipated. The patient indicates understanding of these issues and agrees with the plan.

## 2019-01-16 NOTE — Progress Notes (Signed)
Virtual Visit via Video   Due to the COVID-19 pandemic, this visit was completed with telemedicine (audio/video) technology to reduce patient and provider exposure as well as to preserve personal protective equipment.   I connected with Elissa Hefty by a video enabled telemedicine application and verified that I am speaking with the correct person using two identifiers. Location patient: Home Location provider: [facility], Office Persons participating in the virtual visit: Murrell Converse, MD   I discussed the limitations of evaluation and management by telemedicine and the availability of in person appointments. The patient expressed understanding and agreed to proceed.  Care Team   Patient Care Team: Lucille Passy, MD as PCP - General (Family Medicine) Bary Castilla, Forest Gleason, MD (General Surgery) Lucille Passy, MD as Consulting Physician [facility])  Subjective:   HPI:   Low back pain- has had lower back pain since mowing the yard on 5.2.20 & 5.3.20. Pain radiates intermittently down right leg to the top of his knee. Hurts worse when sitting. Stands while working and hurts less. While drives for an hour home from work his back tends to lock up. The longer he is still the tighter it gets and is a vicious cycle. He has responded well to combination of muscle relaxants and pain medication for a couple of days and is requesting this.   He has not been able to run since this happened.  He is trying to sit less.  Review of Systems  Constitutional: Negative.   HENT: Negative.   Gastrointestinal: Negative.   Genitourinary: Negative.   Musculoskeletal: Positive for back pain.  Skin: Negative.   Neurological: Positive for tingling. Negative for seizures and weakness.  Endo/Heme/Allergies: Negative.   Psychiatric/Behavioral: Negative.   All other systems reviewed and are negative.    Patient Active Problem List   Diagnosis Date Noted  . Skin tag 06/11/2018  .  Acute right lumbar radiculopathy 02/14/2018  . Reflux esophagitis 06/20/2017  . Hemorrhoid 08/03/2015  . OA (osteoarthritis) 12/02/2014  . Low testosterone 05/26/2013  . Allergic rhinitis 05/20/2013  . Generalized anxiety disorder 03/03/2013  . Paroxysmal SVT (supraventricular tachycardia) ([place]) 03/03/2013    Social History   Tobacco Use  . Smoking status: Never Smoker  . Smokeless tobacco: Never Used  Substance Use Topics  . Alcohol use: Yes    Comment: once monthly    Current Outpatient Medications:  .  acetaminophen (TYLENOL) 325 MG tablet, Take 650 mg by mouth every 6 (six) hours as needed., Disp: , Rfl:  .  azelastine (ASTELIN) 0.1 % nasal spray, SPRAY 1-2 SPRAYS IN EACH NOSTRIL TWICE A DAY, Disp: 30 mL, Rfl: PRN .  azelastine (OPTIVAR) 0.05 % ophthalmic solution, INSTILL 1 DROP INTO AFFECTED EYE TWICE A DAY, Disp: , Rfl: 3 .  clonazePAM (KLONOPIN) 0.5 MG tablet, Take one half to one tablet by mouth daily, Disp: 90 tablet, Rfl: 1 .  cyclobenzaprine (FLEXERIL) 10 MG tablet, Take 1 tablet (10 mg total) by mouth 2 (two) times daily as needed for muscle spasms (for muscle spasms in back)., Disp: 60 tablet, Rfl: 0 .  HYDROcodone-acetaminophen (NORCO/VICODIN) 5-325 MG tablet, Take 1 tablet by mouth 2 (two) times daily as needed for moderate pain., Disp: 60 tablet, Rfl: 0 .  montelukast (SINGULAIR) 10 MG tablet, Take 1 tablet (10 mg total) by mouth at bedtime., Disp: 90 tablet, Rfl: 3  Allergies  Allergen Reactions  . Bactrim [Sulfamethoxazole-Trimethoprim] Rash    Objective:  Temp  97.6 F (36.4 C) (Oral)   Wt 169 lb 11.2 oz (77 kg)   BMI 25.61 kg/m   VITALS: Per patient if applicable, see vitals. GENERAL: Alert, appears well and in no acute distress. HEENT: Atraumatic, conjunctiva clear, no obvious abnormalities on inspection of external nose and ears. NECK: Normal movements of the head and neck. CARDIOPULMONARY: No increased WOB. Speaking in clear sentences. I:E ratio WNL.   MS: Moves all visible extremities without noticeable abnormality. PSYCH: Pleasant and cooperative, well-groomed. Speech normal rate and rhythm. Affect is appropriate. Insight and judgement are appropriate. Attention is focused, linear, and appropriate.  NEURO: CN grossly intact. Oriented as arrived to appointment on time with no prompting. Moves both UE equally.  SKIN: No obvious lesions, wounds, erythema, or cyanosis noted on face or hands.  Depression screen [facility] 2/9 02/25/2018 02/01/2017 01/27/2016  Decreased Interest 0 0 0  Down, Depressed, Hopeless 0 0 0  PHQ - 2 Score 0 0 0    Assessment and Plan:   Jesse Neal was seen today for back pain.  Diagnoses and all orders for this visit:  Acute right lumbar radiculopathy  Other orders -     montelukast (SINGULAIR) 10 MG tablet; Take 1 tablet (10 mg total) by mouth at bedtime. -     cyclobenzaprine (FLEXERIL) 10 MG tablet; Take 1 tablet (10 mg total) by mouth 2 (two) times daily as needed for muscle spasms (for muscle spasms in back). -     HYDROcodone-acetaminophen (NORCO/VICODIN) 5-325 MG tablet; Take 1 tablet by mouth 2 (two) times daily as needed for moderate pain.    Marland Kitchen COVID-19 Education: The signs and symptoms of COVID-19 were discussed with the patient and how to seek care for testing if needed. The importance of social distancing was discussed today. . Reviewed expectations re: course of current medical issues. . Discussed self-management of symptoms. . Outlined signs and symptoms indicating need for more acute intervention. . Patient verbalized understanding and all questions were answered. Marland Kitchen Health Maintenance issues including appropriate healthy diet, exercise, and smoking avoidance were discussed with patient. . See orders for this visit as documented in the electronic medical record.  Arnette Norris, MD  Records requested if needed. Time spent: 15 minutes, of which >50% was spent in obtaining information about his symptoms,  reviewing his previous labs, evaluations, and treatments, counseling him about his condition (please see the discussed topics above), and developing a plan to further investigate it; he had a number of questions which I addressed.

## 2019-03-03 DIAGNOSIS — L820 Inflamed seborrheic keratosis: Secondary | ICD-10-CM | POA: Diagnosis not present

## 2019-03-03 DIAGNOSIS — D2361 Other benign neoplasm of skin of right upper limb, including shoulder: Secondary | ICD-10-CM | POA: Diagnosis not present

## 2019-03-03 DIAGNOSIS — L538 Other specified erythematous conditions: Secondary | ICD-10-CM | POA: Diagnosis not present

## 2019-03-03 DIAGNOSIS — D2371 Other benign neoplasm of skin of right lower limb, including hip: Secondary | ICD-10-CM | POA: Diagnosis not present

## 2019-03-19 DIAGNOSIS — M6281 Muscle weakness (generalized): Secondary | ICD-10-CM | POA: Diagnosis not present

## 2019-03-19 DIAGNOSIS — M545 Low back pain: Secondary | ICD-10-CM | POA: Diagnosis not present

## 2019-03-26 ENCOUNTER — Telehealth: Payer: Self-pay

## 2019-03-26 DIAGNOSIS — M545 Low back pain: Secondary | ICD-10-CM | POA: Diagnosis not present

## 2019-03-26 DIAGNOSIS — M6281 Muscle weakness (generalized): Secondary | ICD-10-CM | POA: Diagnosis not present

## 2019-03-26 NOTE — Telephone Encounter (Signed)
Copied from [place] [id number]. Topic: General - Other >> Mar 26, 2019  1:13 PM Antonieta Iba C wrote: Reason for CRM: Calling in to confirm if a form for a Compression Wrap and Ortho Rx for pt's back has been received? Suezanne Jacquet would like a call back to confirm  CB:[phone] Jacquet with [facility]

## 2019-04-01 NOTE — Telephone Encounter (Signed)
Spoke with Suezanne Jacquet from [place] let him know we have received the order. Let him know Dr. Deborra Medina was working remotely today but tomorrow she will be back and i'll have her take a look at the order and sign and fax back over to him.

## 2019-04-14 ENCOUNTER — Encounter: Payer: Self-pay | Admitting: Nurse Practitioner

## 2019-04-14 ENCOUNTER — Telehealth (INDEPENDENT_AMBULATORY_CARE_PROVIDER_SITE_OTHER): Payer: BC Managed Care – PPO | Admitting: Nurse Practitioner

## 2019-04-14 VITALS — HR 65 | Temp 97.7°F

## 2019-04-14 DIAGNOSIS — L237 Allergic contact dermatitis due to plants, except food: Secondary | ICD-10-CM | POA: Diagnosis not present

## 2019-04-14 DIAGNOSIS — L239 Allergic contact dermatitis, unspecified cause: Secondary | ICD-10-CM | POA: Diagnosis not present

## 2019-04-14 MED ORDER — HYDROXYZINE HCL 25 MG PO TABS: 25.0000 mg | ORAL_TABLET | Freq: Every evening | ORAL | 0 refills | Status: DC | PRN

## 2019-04-14 MED ORDER — PREDNISONE 10 MG PO TABS: ORAL_TABLET | ORAL | 0 refills | Status: DC

## 2019-04-14 NOTE — Patient Instructions (Signed)
Poison Ivy Dermatitis Poison ivy dermatitis is redness and soreness of the skin caused by chemicals in the leaves of the poison ivy plant. You may have very bad itching, swelling, a rash, and blisters. What are the causes?  Touching a poison ivy plant.  Touching something that has the chemical on it. This may include animals or objects that have come in contact with the plant. What increases the risk?  Going outdoors often in wooded or marshy areas.  Going outdoors without wearing protective clothing, such as closed shoes, long pants, and a long-sleeved shirt. What are the signs or symptoms?   Skin redness.  Very bad itching.  A rash that often includes bumps and blisters. ? The rash usually appears 48 hours after exposure, if you have been exposed before. ? If this is the first time you have been exposed, the rash may not appear until a week after exposure.  Swelling. This may occur if the reaction is very bad. Symptoms usually last for 1-2 weeks. The first time you develop this condition, symptoms may last 3-4 weeks. How is this treated? This condition may be treated with:  Hydrocortisone cream or calamine lotion to relieve itching.  Oatmeal baths to soothe the skin.  Medicines, such as over-the-counter antihistamine tablets.  Oral steroid medicine for more severe reactions. Follow these instructions at home: Medicines  Take or apply over-the-counter and prescription medicines only as told by your doctor.  Use hydrocortisone cream or calamine lotion as needed to help with itching. General instructions  Do not scratch or rub your skin.  Put a cold, wet cloth (cold compress) on the affected areas or take baths in cool water. This will help with itching.  Avoid hot baths and showers.  Take oatmeal baths as needed. Use colloidal oatmeal. You can get this at a pharmacy or grocery store. Follow the instructions on the package.  While you have the rash, wash your clothes  right after you wear them.  Keep all follow-up visits as told by your health care provider. This is important. How is this prevented?   Know what poison ivy looks like, so you can avoid it. ? This plant has three leaves with flowering branches on a single stem. ? The leaves are glossy. ? The leaves have uneven edges that come to a point at the front.  If you touch poison ivy, wash your skin with soap and water right away. Be sure to wash under your fingernails.  When hiking or camping, wear long pants, a long-sleeved shirt, tall socks, and hiking boots. You can also use a lotion on your skin that helps to prevent contact with poison ivy.  If you think that your clothes or outdoor gear came in contact with poison ivy, rinse them off with a garden hose before you bring them inside your house.  When doing yard work or gardening, wear gloves, long sleeves, long pants, and boots. Wash your garden tools and gloves if they come in contact with poison ivy.  If you think that your pet has come into contact with poison ivy, wash him or her with pet shampoo and water. Make sure to wear gloves while washing your pet. Contact a doctor if:  You have open sores in the rash area.  You have more redness, swelling, or pain in the rash area.  You have redness that spreads beyond the rash area.  You have fluid, blood, or pus coming from the rash area.  You have a   fever.  You have a rash over a large area of your body.  You have a rash on your eyes, mouth, or genitals.  Your rash does not get better after a few weeks. Get help right away if:  Your face swells or your eyes swell shut.  You have trouble breathing.  You have trouble swallowing. These symptoms may be an emergency. Do not wait to see if the symptoms will go away. Get medical help right away. Call your local emergency services (911 in the [place] Do not drive yourself to the hospital. Summary  Poison ivy dermatitis is redness and  soreness of the skin caused by chemicals in the leaves of the poison ivy plant.  You may have skin redness, very bad itching, swelling, and a rash.  Do not scratch or rub your skin.  Take or apply over-the-counter and prescription medicines only as told by your doctor. This information is not intended to replace advice given to you by your health care provider. Make sure you discuss any questions you have with your health care provider. Document Released: 09/30/2010 Document Revised: 12/20/2018 Document Reviewed: 08/23/2018 Elsevier Patient Education  2020 Elsevier Inc.  

## 2019-04-14 NOTE — Progress Notes (Signed)
Virtual Visit via Video Note  I connected with Jesse Neal on 04/14/19 at 10:15 AM EDT by a video enabled telemedicine application and verified that I am speaking with the correct person using two identifiers.  Location: Patient: Home Provider: Office   I discussed the limitations of evaluation and management by telemedicine and the availability of in person appointments. The patient expressed understanding and agreed to proceed.  CC: He is C/O contact dermatitis due to poison ivy in the woods a week ago. Has some blisters and some dots coming up one in the corner of eye and few on his back.   History of Present Illness: Rash This is a new problem. The current episode started in the past 7 days. The problem has been gradually worsening since onset. The rash is diffuse. The rash is characterized by blistering, itchiness and redness. He was exposed to plant contact. Pertinent negatives include no anorexia, congestion, cough, eye pain, facial edema, fatigue, fever, joint pain, rhinorrhea, shortness of breath or sore throat. Past treatments include anti-itch cream. The treatment provided mild relief.   Observations/Objective: Physical Exam  Constitutional: He is oriented to person, place, and time. No distress.  Pulmonary/Chest: Effort normal.  Neurological: He is alert and oriented to person, place, and time.  Skin: Rash noted. Rash is maculopapular and vesicular. There is erythema.     Psychiatric: He has a normal mood and affect. His behavior is normal.   Assessment and Plan: Jesse Neal was seen today for dermatitis.  Diagnoses and all orders for this visit:  Allergic contact dermatitis due to plants, except food -     predniSONE (DELTASONE) 10 MG tablet; Take 4tabs once a day x 2days, then 3tabs once a dayx 2days, then 2tabs once a day x3days, then, 1tab once a day x 3days, then stop -     hydrOXYzine (ATARAX/VISTARIL) 25 MG tablet; Take 1-2 tablets (25-50 mg total) by mouth at  bedtime as needed.   Follow Up Instructions: See AVS   I discussed the assessment and treatment plan with the patient. The patient was provided an opportunity to ask questions and all were answered. The patient agreed with the plan and demonstrated an understanding of the instructions.   The patient was advised to call back or seek an in-person evaluation if the symptoms worsen or if the condition fails to improve as anticipated.   Wilfred Lacy, NP

## 2019-04-21 ENCOUNTER — Telehealth: Payer: BLUE CROSS/BLUE SHIELD | Admitting: Family Medicine

## 2019-08-21 ENCOUNTER — Encounter: Payer: Self-pay | Admitting: Family Medicine

## 2019-08-21 ENCOUNTER — Other Ambulatory Visit: Payer: Self-pay

## 2019-08-21 ENCOUNTER — Telehealth (INDEPENDENT_AMBULATORY_CARE_PROVIDER_SITE_OTHER): Payer: BC Managed Care – PPO | Admitting: Family Medicine

## 2019-08-21 VITALS — Temp 97.9°F

## 2019-08-21 DIAGNOSIS — R197 Diarrhea, unspecified: Secondary | ICD-10-CM | POA: Insufficient documentation

## 2019-08-21 DIAGNOSIS — Z20822 Contact with and (suspected) exposure to covid-19: Secondary | ICD-10-CM | POA: Insufficient documentation

## 2019-08-21 DIAGNOSIS — Z20828 Contact with and (suspected) exposure to other viral communicable diseases: Secondary | ICD-10-CM | POA: Diagnosis not present

## 2019-08-21 DIAGNOSIS — K2100 Gastro-esophageal reflux disease with esophagitis, without bleeding: Secondary | ICD-10-CM | POA: Diagnosis not present

## 2019-08-21 MED ORDER — OMEPRAZOLE 20 MG PO CPDR: 20.0000 mg | DELAYED_RELEASE_CAPSULE | Freq: Two times a day (BID) | ORAL | 3 refills | Status: DC

## 2019-08-21 NOTE — Progress Notes (Signed)
Virtual Visit via Video   Due to the COVID-19 pandemic, this visit was completed with telemedicine (audio/video) technology to reduce patient and provider exposure as well as to preserve personal protective equipment.   I connected with Jesse Neal by a video enabled telemedicine application and verified that I am speaking with the correct person using two identifiers. Location patient: Home Location provider: [facility], Office Persons participating in the virtual visit: Murrell Converse, MD   I discussed the limitations of evaluation and management by telemedicine and the availability of in person appointments. The patient expressed understanding and agreed to proceed.  Interactive audio and video telecommunications were attempted between this provider and patient, however failed, due to patient having technical difficulties OR patient did not have access to video capability.  We continued and completed visit with audio only.  Care Team   Patient Care Team: Lucille Passy, MD as PCP - General (Family Medicine) Bary Castilla, Forest Gleason, MD (General Surgery) Lucille Passy, MD as Consulting Physician (Family Medicine)  Subjective:   HPI:   2 weeks ago- developed a sore throat, a few days later, loss of appetite. Started zantac 6 days ago which did not help much.  Gassy, has diarrhea.  No blood in stool.  No fever, chills, cough, congestion, runny nose, shortness of breath, fatigue, body aches, sore throat, headache, nausea, vomiting,  or new loss of taste or smell. No known contacts with covid 19 or someone being tested for covid 19.      Review of Systems  Constitutional: Positive for chills and malaise/fatigue. Negative for fever.  HENT: Positive for sore throat. Negative for congestion and hearing loss.   Eyes: Negative.  Negative for blurred vision, discharge and redness.  Respiratory: Negative.  Negative for cough and shortness of breath.   Cardiovascular:  Negative.  Negative for chest pain, palpitations and leg swelling.  Gastrointestinal: Positive for diarrhea and heartburn. Negative for abdominal pain, blood in stool, constipation and melena.       GERD  Genitourinary: Negative.  Negative for dysuria.  Musculoskeletal: Negative for falls and myalgias.  Skin: Negative for rash.  Neurological: Negative for loss of consciousness and headaches.  Endo/Heme/Allergies: Does not bruise/bleed easily.  Psychiatric/Behavioral: Negative for depression.  All other systems reviewed and are negative.    Patient Active Problem List   Diagnosis Date Noted  . Diarrhea 08/21/2019  . Suspected COVID-19 virus infection 08/21/2019  . Skin tag 06/11/2018  . Acute right lumbar radiculopathy 02/14/2018  . Reflux esophagitis 06/20/2017  . Hemorrhoid 08/03/2015  . OA (osteoarthritis) 12/02/2014  . Low testosterone 05/26/2013  . Allergic rhinitis 05/20/2013  . Generalized anxiety disorder 03/03/2013  . Paroxysmal SVT (supraventricular tachycardia) ([place]) 03/03/2013    Social History   Tobacco Use  . Smoking status: Never Smoker  . Smokeless tobacco: Never Used  Substance Use Topics  . Alcohol use: Yes    Comment: once monthly    Current Outpatient Medications:  .  acetaminophen (TYLENOL) 325 MG tablet, Take 650 mg by mouth every 6 (six) hours as needed., Disp: , Rfl:  .  azelastine (ASTELIN) 0.1 % nasal spray, SPRAY 1-2 SPRAYS IN EACH NOSTRIL TWICE A DAY, Disp: 30 mL, Rfl: PRN .  azelastine (OPTIVAR) 0.05 % ophthalmic solution, INSTILL 1 DROP INTO AFFECTED EYE TWICE A DAY, Disp: , Rfl: 3 .  clonazePAM (KLONOPIN) 0.5 MG tablet, Take one half to one tablet by mouth daily, Disp: 90 tablet, Rfl: 1 .  cyclobenzaprine (FLEXERIL) 10 MG tablet, Take 1 tablet (10 mg total) by mouth 2 (two) times daily as needed for muscle spasms (for muscle spasms in back)., Disp: 60 tablet, Rfl: 0 .  HYDROcodone-acetaminophen (NORCO/VICODIN) 5-325 MG tablet, Take 1 tablet by  mouth 2 (two) times daily as needed for moderate pain., Disp: 60 tablet, Rfl: 0 .  hydrOXYzine (ATARAX/VISTARIL) 25 MG tablet, Take 1-2 tablets (25-50 mg total) by mouth at bedtime as needed., Disp: 14 tablet, Rfl: 0 .  montelukast (SINGULAIR) 10 MG tablet, Take 1 tablet (10 mg total) by mouth at bedtime., Disp: 90 tablet, Rfl: 3 .  omeprazole (PRILOSEC) 20 MG capsule, Take 1 capsule (20 mg total) by mouth 2 (two) times daily before a meal., Disp: 60 capsule, Rfl: 3 .  predniSONE (DELTASONE) 10 MG tablet, Take 4tabs once a day x 2days, then 3tabs once a dayx 2days, then 2tabs once a day x3days, then, 1tab once a day x 3days, then stop, Disp: 23 tablet, Rfl: 0  Allergies  Allergen Reactions  . Bactrim [Sulfamethoxazole-Trimethoprim] Rash    Objective:  Temp 97.9 F (36.6 C)   VITALS: Per patient if applicable, see vitals. GENERAL: Alert, appears well and in no acute distress. HEENT: Atraumatic, conjunctiva clear, no obvious abnormalities on inspection of external nose and ears. NECK: Normal movements of the head and neck. CARDIOPULMONARY: No increased WOB. Speaking in clear sentences. I:E ratio WNL.  MS: Moves all visible extremities without noticeable abnormality. PSYCH: Pleasant and cooperative, well-groomed. Speech normal rate and rhythm. Affect is appropriate. Insight and judgement are appropriate. Attention is focused, linear, and appropriate.  NEURO: CN grossly intact. Oriented as arrived to appointment on time with no prompting. Moves both UE equally.  SKIN: No obvious lesions, wounds, erythema, or cyanosis noted on face or hands.  Depression screen [facility] 2/9 04/14/2019 02/25/2018 02/01/2017  Decreased Interest 0 0 0  Down, Depressed, Hopeless 0 0 0  PHQ - 2 Score 0 0 0     . COVID-19 Education: The signs and symptoms of COVID-19 were discussed with the patient and how to seek care for testing if needed. The importance of social distancing was discussed today. . Reviewed expectations  re: course of current medical issues. . Discussed self-management of symptoms. . Outlined signs and symptoms indicating need for more acute intervention. . Patient verbalized understanding and all questions were answered. Marland Kitchen Health Maintenance issues including appropriate healthy diet, exercise, and smoking avoidance were discussed with patient. . See orders for this visit as documented in the electronic medical record.  Arnette Norris, MD  Records requested if needed. Time spent: 25 minutes, of which >50% was spent in obtaining information about his symptoms, reviewing his previous labs, evaluations, and treatments, counseling him about his condition (please see the discussed topics above), and developing a plan to further investigate it; he had a number of questions which I addressed.   Lab Results  Component Value Date   WBC 4.6 05/22/2018   HGB 14.9 05/22/2018   HCT 43.4 05/22/2018   PLT 194.0 05/22/2018   GLUCOSE 97 05/22/2018   CHOL 142 05/22/2018   TRIG 77.0 05/22/2018   HDL 46.80 05/22/2018   LDLDIRECT 103.0 01/24/2016   LDLCALC 80 05/22/2018   ALT 16 05/22/2018   AST 17 05/22/2018   NA 140 05/22/2018   K 4.3 05/22/2018   CL 104 05/22/2018   CREATININE 0.98 05/22/2018   BUN 11 05/22/2018   CO2 31 05/22/2018   PSA 0.59 05/22/2018  HGBA1C 5.5 05/26/2013    No results found for: TSH Lab Results  Component Value Date   WBC 4.6 05/22/2018   HGB 14.9 05/22/2018   HCT 43.4 05/22/2018   MCV 90.5 05/22/2018   PLT 194.0 05/22/2018   Lab Results  Component Value Date   NA 140 05/22/2018   K 4.3 05/22/2018   CO2 31 05/22/2018   GLUCOSE 97 05/22/2018   BUN 11 05/22/2018   CREATININE 0.98 05/22/2018   BILITOT 0.7 05/22/2018   ALKPHOS 46 05/22/2018   AST 17 05/22/2018   ALT 16 05/22/2018   PROT 6.6 05/22/2018   ALBUMIN 4.6 05/22/2018   CALCIUM 9.5 05/22/2018   GFR 84.79 05/22/2018   Lab Results  Component Value Date   CHOL 142 05/22/2018   Lab Results  Component  Value Date   HDL 46.80 05/22/2018   Lab Results  Component Value Date   LDLCALC 80 05/22/2018   Lab Results  Component Value Date   TRIG 77.0 05/22/2018   Lab Results  Component Value Date   CHOLHDL 3 05/22/2018   Lab Results  Component Value Date   HGBA1C 5.5 05/26/2013       Assessment & Plan:   Problem List Items Addressed This Visit      Active Problems   Reflux esophagitis - Primary   Diarrhea   Suspected COVID-19 virus infection    Several symptoms that do not quite add up so I do want him to get tested for covid.  He is going to go to the drive through center now.  He is working from home and therefore is isolating.  Discussed isolation precautions further.   Also he is having GERD- zantac ineffective so will call in omeprazole 20 mg twice daily with meals for two weeks. Call or send my chart message prn if these symptoms worsen or fail to improve as anticipated. The patient indicates understanding of these issues and agrees with the plan.       Relevant Orders   Novel Coronavirus, NAA ([facility])      I am having Quita Skye B. Setterlund start on omeprazole. I am also having him maintain his acetaminophen, azelastine, azelastine, clonazePAM, montelukast, cyclobenzaprine, HYDROcodone-acetaminophen, predniSONE, and hydrOXYzine.  Meds ordered this encounter  Medications  . omeprazole (PRILOSEC) 20 MG capsule    Sig: Take 1 capsule (20 mg total) by mouth 2 (two) times daily before a meal.    Dispense:  60 capsule    Refill:  3     Arnette Norris, MD

## 2019-08-21 NOTE — Assessment & Plan Note (Signed)
Several symptoms that do not quite add up so I do want him to get tested for covid.  He is going to go to the drive through center now.  He is working from home and therefore is isolating.  Discussed isolation precautions further.   Also he is having GERD- zantac ineffective so will call in omeprazole 20 mg twice daily with meals for two weeks. Call or send my chart message prn if these symptoms worsen or fail to improve as anticipated. The patient indicates understanding of these issues and agrees with the plan.

## 2019-08-23 LAB — NOVEL CORONAVIRUS, NAA: SARS-CoV-2, NAA: NOT DETECTED

## 2019-09-02 ENCOUNTER — Encounter: Payer: Self-pay | Admitting: Family Medicine

## 2019-12-11 DIAGNOSIS — M47816 Spondylosis without myelopathy or radiculopathy, lumbar region: Secondary | ICD-10-CM | POA: Diagnosis not present

## 2019-12-11 DIAGNOSIS — M961 Postlaminectomy syndrome, not elsewhere classified: Secondary | ICD-10-CM | POA: Diagnosis not present

## 2020-02-23 ENCOUNTER — Ambulatory Visit (INDEPENDENT_AMBULATORY_CARE_PROVIDER_SITE_OTHER): Payer: BC Managed Care – PPO | Admitting: Family Medicine

## 2020-02-23 ENCOUNTER — Other Ambulatory Visit: Payer: Self-pay

## 2020-02-23 ENCOUNTER — Encounter: Payer: Self-pay | Admitting: Family Medicine

## 2020-02-23 VITALS — BP 112/70 | HR 71 | Temp 98.0°F | Ht 68.0 in | Wt 177.4 lb

## 2020-02-23 DIAGNOSIS — Z7689 Persons encountering health services in other specified circumstances: Secondary | ICD-10-CM

## 2020-02-23 DIAGNOSIS — R42 Dizziness and giddiness: Secondary | ICD-10-CM | POA: Diagnosis not present

## 2020-02-23 DIAGNOSIS — Z125 Encounter for screening for malignant neoplasm of prostate: Secondary | ICD-10-CM

## 2020-02-23 DIAGNOSIS — Q178 Other specified congenital malformations of ear: Secondary | ICD-10-CM

## 2020-02-23 DIAGNOSIS — J310 Chronic rhinitis: Secondary | ICD-10-CM | POA: Diagnosis not present

## 2020-02-23 MED ORDER — FLUTICASONE PROPIONATE 50 MCG/ACT NA SUSP: 2.0000 | Freq: Every day | NASAL | 6 refills | Status: DC

## 2020-02-23 MED ORDER — AZELASTINE HCL 0.1 % NA SOLN: 2.0000 | Freq: Two times a day (BID) | NASAL | 12 refills | Status: DC | PRN

## 2020-02-23 NOTE — Patient Instructions (Signed)
I have sent in two new nasal sprays, stop the nasacourt, stop Tylenol sinus, can use Afrin short term if you have any rebound congestion  Follow up via mychart in 2 weeks

## 2020-02-23 NOTE — Progress Notes (Signed)
Subjective:    Patient ID: Jesse Neal, male    DOB: [birth year], 54 y.o.   MRN: [id number]  HPI Chief Complaint  Patient presents with  . New Patient (Initial Visit)    TOC Dr Jesse Neal.    This is a 54 yo male who presents today to transfer care. Prior patient of Dr. Deborra Neal. 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 Goes into the office, commutes to [place].   Last CPE- 06/11/2018 PSA- 2019 Colonoscopy- 04/19/2017 Tdap- 09/24/2017 Flu- annual Exercise- not as consistent with running since Covid. Feels sluggish. Was running about 25 miles a week. Tried to resume x 2 but struggled. ? Overdid it.  Allergies- ongoing problems, currently using Nasacourt, Allegra (occasionally), Zyrtec, AFrin occasionally. Had allergy testing 2 years ago. Negative skin testing. Symptoms year round, better if really hot outside. Some help with nasal irrigation. Has also seen ENT who offered surgery but was not sure if it would fix his symptoms.   Back pain- problems last summer, had PT.   Dizzy- thinks it is allergies, ear popping, no spinning, feels like sea sickness. Worse in bigger, more open spaces.   Review of Systems No chest pain, SOB, abdominal pain, diarrhea/ constipation, dysuria, hematuria, nocturia, leg swelling.     Objective:   Physical Exam Vitals reviewed.  Constitutional:      General: He is not in acute distress.    Appearance: Normal appearance. He is normal weight. He is not ill-appearing, toxic-appearing or diaphoretic.  HENT:     Head: Normocephalic and atraumatic.     Right Ear: Ear canal and external ear normal.     Left Ear: Ear canal and external ear normal.     Ears:     Comments: Bilateral TM dull.     Nose: No congestion or rhinorrhea.     Comments: Deviated septum with narrow nasal passages.     Mouth/Throat:     Mouth: Mucous membranes are moist.     Pharynx: Oropharynx is clear.  Eyes:     Conjunctiva/sclera: Conjunctivae normal.  Cardiovascular:      Rate and Rhythm: Normal rate and regular rhythm.     Pulses: Normal pulses.     Heart sounds: Normal heart sounds.  Pulmonary:     Breath sounds: Normal breath sounds.  Musculoskeletal:     Right lower leg: No edema.     Left lower leg: No edema.  Skin:    General: Skin is warm and dry.  Neurological:     Mental Status: He is alert and oriented to person, place, and time.  Psychiatric:        Mood and Affect: Mood normal.        Behavior: Behavior normal.        Thought Content: Thought content normal.        Judgment: Judgment normal.      BP 112/70 (BP Location: Left Arm, Patient Position: Sitting, Cuff Size: Normal)   Pulse 71   Temp 98 F (36.7 C) (Temporal)   Ht 5\' 8"  (1.727 m)   Wt 177 lb 6.4 oz (80.5 kg)   SpO2 98%   BMI 26.97 kg/m  Wt Readings from Last 3 Encounters:  02/23/20 177 lb 6.4 oz (80.5 kg)  01/16/19 169 lb 11.2 oz (77 kg)  06/11/18 173 lb 6.4 oz (78.7 kg)        Assessment & Plan:  1. Encounter to establish care - reviewed EMR ,  [facility]. No recent labs, will obtain today.   2. Rhinitis, unspecified type - longstanding, advised him to stop decongestant use, at least temporarily.  - will change Nasacort to fluticasone, add azelastine, PRN AFrin for max 3 days, discussed antihistamine use and can add if needed - azelastine (ASTELIN) 0.1 % nasal spray; Place 2 sprays into both nostrils 2 (two) times daily as needed for rhinitis. Use in each nostril as directed  Dispense: 30 mL; Refill: 12 - fluticasone (FLONASE) 50 MCG/ACT nasal spray; Place 2 sprays into both nostrils daily.  Dispense: 16 g; Refill: 6  3. Lightheadedness - CBC with Differential - Comprehensive metabolic panel - Lipid Panel - TSH - Vitamin D, 25-hydroxy  4. Eustachian tube anomaly - per #2  5. Screening for prostate cancer - PSA  - follow up to be determined by labs/ symptoms  This visit occurred during the SARS-CoV-2 public health emergency.  Safety protocols were in place,  including screening questions prior to the visit, additional usage of staff PPE, and extensive cleaning of exam room while observing appropriate contact time as indicated for disinfecting solutions.    Clarene Reamer, FNP-BC  [facility] [place] [facility]  02/24/2020 8:14 AM

## 2020-02-24 LAB — CBC WITH DIFFERENTIAL/PLATELET
Basophils Absolute: 0.1 10*3/uL (ref 0.0–0.1)
Basophils Relative: 1.1 % (ref 0.0–3.0)
Eosinophils Absolute: 0.2 10*3/uL (ref 0.0–0.7)
Eosinophils Relative: 2.6 % (ref 0.0–5.0)
HCT: 43.8 % (ref 39.0–52.0)
Hemoglobin: 14.9 g/dL (ref 13.0–17.0)
Lymphocytes Relative: 31.6 % (ref 12.0–46.0)
Lymphs Abs: 2.4 10*3/uL (ref 0.7–4.0)
MCHC: 34 g/dL (ref 30.0–36.0)
MCV: 91.4 fl (ref 78.0–100.0)
Monocytes Absolute: 0.5 10*3/uL (ref 0.1–1.0)
Monocytes Relative: 6 % (ref 3.0–12.0)
Neutro Abs: 4.4 10*3/uL (ref 1.4–7.7)
Neutrophils Relative %: 58.7 % (ref 43.0–77.0)
Platelets: 211 10*3/uL (ref 150.0–400.0)
RBC: 4.8 Mil/uL (ref 4.22–5.81)
RDW: 12.9 % (ref 11.5–15.5)
WBC: 7.5 10*3/uL (ref 4.0–10.5)

## 2020-02-24 LAB — COMPREHENSIVE METABOLIC PANEL
ALT: 19 U/L (ref 0–53)
AST: 22 U/L (ref 0–37)
Albumin: 4.7 g/dL (ref 3.5–5.2)
Alkaline Phosphatase: 62 U/L (ref 39–117)
BUN: 16 mg/dL (ref 6–23)
CO2: 30 mEq/L (ref 19–32)
Calcium: 9.4 mg/dL (ref 8.4–10.5)
Chloride: 102 mEq/L (ref 96–112)
Creatinine, Ser: 0.95 mg/dL (ref 0.40–1.50)
GFR: 82.15 mL/min (ref >60.00)
Glucose, Bld: 88 mg/dL (ref 70–99)
Potassium: 4.3 mEq/L (ref 3.5–5.1)
Sodium: 138 mEq/L (ref 135–145)
Total Bilirubin: 0.4 mg/dL (ref 0.2–1.2)
Total Protein: 6.8 g/dL (ref 6.0–8.3)

## 2020-02-24 LAB — PSA: PSA: 0.45 ng/mL (ref 0.10–4.00)

## 2020-02-24 LAB — LIPID PANEL
Cholesterol: 171 mg/dL (ref 0–200)
HDL: 41.3 mg/dL (ref >39.00)
NonHDL: 129.35
Total CHOL/HDL Ratio: 4
Triglycerides: 283 mg/dL — ABNORMAL HIGH (ref 0.0–149.0)
VLDL: 56.6 mg/dL — ABNORMAL HIGH (ref 0.0–40.0)

## 2020-02-24 LAB — VITAMIN D 25 HYDROXY (VIT D DEFICIENCY, FRACTURES): VITD: 31.15 ng/mL (ref 30.00–100.00)

## 2020-02-24 LAB — TSH: TSH: 2.85 u[IU]/mL (ref 0.35–4.50)

## 2020-02-24 LAB — LDL CHOLESTEROL, DIRECT: Direct LDL: 101 mg/dL

## 2020-04-05 DIAGNOSIS — R519 Headache, unspecified: Secondary | ICD-10-CM | POA: Diagnosis not present

## 2020-04-26 DIAGNOSIS — R002 Palpitations: Secondary | ICD-10-CM | POA: Insufficient documentation

## 2020-04-26 DIAGNOSIS — R0602 Shortness of breath: Secondary | ICD-10-CM | POA: Insufficient documentation

## 2020-06-25 ENCOUNTER — Other Ambulatory Visit: Payer: Self-pay

## 2020-06-25 ENCOUNTER — Ambulatory Visit (INDEPENDENT_AMBULATORY_CARE_PROVIDER_SITE_OTHER): Payer: BC Managed Care – PPO | Admitting: Family Medicine

## 2020-06-25 ENCOUNTER — Encounter: Payer: Self-pay | Admitting: Family Medicine

## 2020-06-25 VITALS — BP 126/78 | HR 64 | Temp 97.9°F | Ht 68.0 in | Wt 171.5 lb

## 2020-06-25 DIAGNOSIS — R1013 Epigastric pain: Secondary | ICD-10-CM

## 2020-06-25 DIAGNOSIS — R002 Palpitations: Secondary | ICD-10-CM | POA: Diagnosis not present

## 2020-06-25 DIAGNOSIS — F411 Generalized anxiety disorder: Secondary | ICD-10-CM | POA: Diagnosis not present

## 2020-06-25 NOTE — Patient Instructions (Signed)
Good to see you today  Please start over the counter Pepcid AC, twice a day. Store brand is fine, let me know in 2 weeks how you are doing  You will get a call about an ultrasound.

## 2020-06-25 NOTE — Progress Notes (Signed)
Subjective:    Patient ID: Jesse Neal, male    DOB: 03/17/1966, 54 y.o.   MRN: [id number]  HPI Chief Complaint  Patient presents with  . Panic Attack    related to heart issue from 2 months ago...triggered by stomach when pt eats  . Abdominal Pain    x 53months  . Arm Pain    right x1 month...no known injury   Long history of palpitations since 2004, negative work up. Triggered by his stomach. Had additional episode, years ago, was put on beta blockers. Symptoms resolved, he came off.  Two months ago, had abdominal pain after lunch. Now with palpitations daily, seems to be after stomach pain, driving into work, after eating. Also with symptoms with moving, walking.  Has abdominal pain, sharp, unknown trigger. Previously on omeprazole.  Has had cardiac work up last month, everything was negative.  Has had "tick fever," not sure how it was diagnosed. Was wondering if he should have testing for tickborne illness.  He denies any recent known tick bites, rash, headache, generalized joint/muscle pain  Right shoulder pain times approximately 1 month.  No known injury but he does do a lot of fishing.  Has not taken anything for symptoms  Has been on medication for anxiety before, currently taking clonazepam twice a day with good results.   Review of Systems Denies headache, chest pain, shortness of breath, diarrhea, constipation    Objective:   Physical Exam Physical Exam  Constitutional: Oriented to person, place, and time. Appears well-developed and well-nourished.  HENT:  Head: Normocephalic and atraumatic.  Eyes: Conjunctivae are normal.  Neck: Normal range of motion. Neck supple.  Cardiovascular: Normal rate, regular rhythm and normal heart sounds.   Pulmonary/Chest: Effort normal and breath sounds normal.  Musculoskeletal: No lower extremity edema.  Right shoulder with good range of motion, some popping, joint space is nontender. Neurological: Alert and oriented to person,  place, and time.  Skin: Skin is warm and dry.  Psychiatric: Appears anxious. Behavior is normal. Judgment and thought content normal.  Vitals reviewed.     BP 126/78   Pulse 64   Temp 97.9 F (36.6 C) (Temporal)   Ht 5\' 8"  (1.727 m)   Wt 171 lb 8 oz (77.8 kg)   SpO2 99%   BMI 26.08 kg/m  Wt Readings from Last 3 Encounters:  06/25/20 171 lb 8 oz (77.8 kg)  02/23/20 177 lb 6.4 oz (80.5 kg)  01/16/19 169 lb 11.2 oz (77 kg)   Depression screen [facility] 2/9 06/25/2020 02/23/2020 04/14/2019 02/25/2018 02/01/2017  Decreased Interest 3 0 0 0 0  Down, Depressed, Hopeless 2 0 0 0 0  PHQ - 2 Score 5 0 0 0 0  Altered sleeping 0 - - - -  Tired, decreased energy 1 - - - -  Change in appetite 0 - - - -  Feeling bad or failure about yourself  0 - - - -  Trouble concentrating 2 - - - -  Moving slowly or fidgety/restless 0 - - - -  Suicidal thoughts 0 - - - -  PHQ-9 Score 8 - - - -  Difficult doing work/chores Extremely dIfficult - - - -   Depression screen [facility] 2/9 06/25/2020 02/23/2020 04/14/2019 02/25/2018 02/01/2017  Decreased Interest 3 0 0 0 0  Down, Depressed, Hopeless 2 0 0 0 0  PHQ - 2 Score 5 0 0 0 0  Altered sleeping 0 - - - -  Tired,  decreased energy 1 - - - -  Change in appetite 0 - - - -  Feeling bad or failure about yourself  0 - - - -  Trouble concentrating 2 - - - -  Moving slowly or fidgety/restless 0 - - - -  Suicidal thoughts 0 - - - -  PHQ-9 Score 8 - - - -  Difficult doing work/chores Extremely dIfficult - - - -        Assessment & Plan:  1. Epigastric pain -Unclear etiology, difficult to ascertain whether anxiety causes pain or pain is causing palpitations  - will have him start Pepcid AC bid.  - Check [place] for gallbladder disease - US Abdomen Limited; Future  2. Palpitations - if no improvement with above, will add beta blocker  3. Generalized anxiety disorder - will see if improvement with above, discussed clonazepam use, important to use sparingly - follow up to  be determined by above  4. Right shoulder pain - discussed otc analgesics, heat, ice, ROM - if no improvement, follow up with ortho/ sports med  This visit occurred during the SARS-CoV-2 public health emergency.  Safety protocols were in place, including screening questions prior to the visit, additional usage of staff PPE, and extensive cleaning of exam room while observing appropriate contact time as indicated for disinfecting solutions.     Clarene Reamer, FNP-BC  [facility] at [facility] [place] [facility]  06/25/2020 2:11 PM

## 2020-06-28 ENCOUNTER — Encounter: Payer: Self-pay | Admitting: Family Medicine

## 2020-06-28 ENCOUNTER — Other Ambulatory Visit: Payer: Self-pay | Admitting: Family Medicine

## 2020-06-28 DIAGNOSIS — F411 Generalized anxiety disorder: Secondary | ICD-10-CM

## 2020-06-28 MED ORDER — CLONAZEPAM 0.5 MG PO TABS: 0.2500 mg | ORAL_TABLET | Freq: Every day | ORAL | 0 refills | Status: DC | PRN

## 2020-07-02 ENCOUNTER — Encounter: Payer: Self-pay | Admitting: Family Medicine

## 2020-07-05 ENCOUNTER — Other Ambulatory Visit: Payer: Self-pay | Admitting: Family Medicine

## 2020-07-05 ENCOUNTER — Encounter: Payer: Self-pay | Admitting: Family Medicine

## 2020-07-05 DIAGNOSIS — F411 Generalized anxiety disorder: Secondary | ICD-10-CM

## 2020-07-05 MED ORDER — ESCITALOPRAM OXALATE 10 MG PO TABS: 10.0000 mg | ORAL_TABLET | Freq: Every day | ORAL | 2 refills | Status: DC

## 2020-07-08 ENCOUNTER — Other Ambulatory Visit: Payer: Self-pay

## 2020-07-08 ENCOUNTER — Ambulatory Visit
Admission: RE | Admit: 2020-07-08 | Discharge: 2020-07-08 | Disposition: A | Discharge status: Home / Self Care | Payer: BC Managed Care – PPO | Source: Ambulatory Visit | Attending: Family Medicine | Admitting: Family Medicine

## 2020-07-08 DIAGNOSIS — R1013 Epigastric pain: Secondary | ICD-10-CM | POA: Diagnosis not present

## 2020-07-11 ENCOUNTER — Encounter: Payer: Self-pay | Admitting: Family Medicine

## 2020-07-19 ENCOUNTER — Encounter: Payer: Self-pay | Admitting: Family Medicine

## 2020-07-21 ENCOUNTER — Ambulatory Visit (INDEPENDENT_AMBULATORY_CARE_PROVIDER_SITE_OTHER): Payer: BC Managed Care – PPO | Admitting: Family Medicine

## 2020-07-21 ENCOUNTER — Other Ambulatory Visit: Payer: Self-pay

## 2020-07-21 ENCOUNTER — Encounter: Payer: Self-pay | Admitting: Family Medicine

## 2020-07-21 VITALS — BP 118/84 | HR 63 | Temp 98.3°F | Ht 68.0 in | Wt 166.0 lb

## 2020-07-21 DIAGNOSIS — R1013 Epigastric pain: Secondary | ICD-10-CM | POA: Diagnosis not present

## 2020-07-21 DIAGNOSIS — G8929 Other chronic pain: Secondary | ICD-10-CM

## 2020-07-21 DIAGNOSIS — R634 Abnormal weight loss: Secondary | ICD-10-CM

## 2020-07-21 DIAGNOSIS — M25511 Pain in right shoulder: Secondary | ICD-10-CM

## 2020-07-21 DIAGNOSIS — F411 Generalized anxiety disorder: Secondary | ICD-10-CM | POA: Diagnosis not present

## 2020-07-21 NOTE — Progress Notes (Signed)
Subjective:    Patient ID: Jesse Neal, male    DOB: 23-Apr-1966, 54 y.o.   MRN: [id number]  HPI Chief Complaint  Patient presents with  . Follow-up    anxiety and drepression  . Shoulder Pain    x 3 months still hurts especially when reach out or above head. Hurt when extending using a mouse   Presents today for follow up of anxiety and depression. Has started on escitalopram 10 mg about 1.5 weeks ago. Also did a blood allergy test and has changed diet, smaller portions. Continues to feel like he has a knot in stomach, eating small amounts.  Felt like stomach pain was creating heart palpitations. This is better, with less tachycardia. Taking famotidine bid, not sure if helping. Feels less like "someone has punched me in the stomach." Seeing counselor which is helpful.   Brings FMLA paperwork today, discusses how it is sometimes difficult for him to work a full day and mornings are sometimes rough.  Right shoulder- a little better. Still is bothering him with certain movements, especially using computer mouse. Tylenol with little relief.  Thinks this is related to fishing and casting a lot over the summer.   Review of Systems No odynophagia, dysphagia. Headaches better.     Objective:   Physical Exam Physical Exam  Constitutional: Oriented to person, place, and time. Appears well-developed and well-nourished.  HENT:  Head: Normocephalic and atraumatic.  Eyes: Conjunctivae are normal.  Neck: Normal range of motion. Neck supple.  Cardiovascular: Normal rate, regular rhythm and normal heart sounds.   Pulmonary/Chest: Effort normal and breath sounds normal.  Musculoskeletal: No lower extremity edema.  Right shoulder with good range of motion, nontender to palpation. Neurological: Alert and oriented to person, place, and time.  Skin: Skin is warm and dry.  Psychiatric: Normal mood and affect. Behavior is normal. Judgment and thought content normal.  Vitals reviewed.     BP  118/84   Pulse 63   Temp 98.3 F (36.8 C) (Temporal)   Ht 5\' 8"  (1.727 m)   Wt 166 lb (75.3 kg)   SpO2 99%   BMI 25.24 kg/m  Wt Readings from Last 3 Encounters:  07/21/20 166 lb (75.3 kg)  06/25/20 171 lb 8 oz (77.8 kg)  02/23/20 177 lb 6.4 oz (80.5 kg)   GAD 7 : Generalized Anxiety Score 07/21/2020 06/25/2020  Nervous, Anxious, on Edge 3 3  Control/stop worrying 2 2  Worry too much - different things 2 3  Trouble relaxing 3 3  Restless 1 1  Easily annoyed or irritable 1 3  Afraid - awful might happen 3 3  Total GAD 7 Score 15 18  Anxiety Difficulty - Very difficult    Depression screen [facility] 2/9 07/21/2020 06/25/2020 02/23/2020 04/14/2019 02/25/2018  Decreased Interest 1 3 0 0 0  Down, Depressed, Hopeless 2 2 0 0 0  PHQ - 2 Score 3 5 0 0 0  Altered sleeping 2 0 - - -  Tired, decreased energy 2 1 - - -  Change in appetite 1 0 - - -  Feeling bad or failure about yourself  2 0 - - -  Trouble concentrating 2 2 - - -  Moving slowly or fidgety/restless 0 0 - - -  Suicidal thoughts 0 0 - - -  PHQ-9 Score 12 8 - - -  Difficult doing work/chores Very difficult Extremely dIfficult - - -       Assessment & Plan:  1. Epigastric pain -Pain is unusual, not responding to over-the-counter H2 blockers, ultrasound showed some gallbladder sludge, not likely causing symptoms -Given prolonged symptoms and negative cardiology work-up, will refer to GI - Ambulatory referral to Gastroenterology  2. Unintentional weight loss -Per #1 - Ambulatory referral to Gastroenterology  3. Generalized anxiety disorder -Is recently started escitalopram, some slight improvement in GAD-7.  Discussed increasing to 20 mg which she will consider  4. Chronic right shoulder pain -Interfering with his work, had discussed previously him seeing Ortho or sports medicine which I think would be helpful as they could possibly do an injection and decide if he needs further imaging -He is agreeable to seeing Dr.  Lorelei Pont, sports medicine, here at the office  -I completed his paperwork for intermittent leave as needed, copy scanned to EMR -Follow-up in 3 months, sooner if needed   Clarene Reamer, FNP-BC  [facility] at [facility] [place]  07/22/2020 3:19 PM

## 2020-07-21 NOTE — Patient Instructions (Signed)
Good to see you today  Please follow up in 8-10 weeks  I will put in a referral for gastro- Dr. Vicente Males  Follow up with Dr. Lorelei Pont for your shoulder

## 2020-07-25 NOTE — Progress Notes (Signed)
[name] Philipp, MD, [place]  Primary Care and [place] at [facility] [place], [address]  Phone: [phone]  FAX: [phone]  Jesse Neal - 54 y.o. male  MRN [id number]  Date of Birth: 01/24/1966  Date: 07/26/2020  PCP: Elby Beck, FNP  Referral: Elby Beck, FNP  Chief Complaint  Patient presents with  . Shoulder Pain    Right    This visit occurred during the SARS-CoV-2 public health emergency.  Safety protocols were in place, including screening questions prior to the visit, additional usage of staff PPE, and extensive cleaning of exam room while observing appropriate contact time as indicated for disinfecting solutions.   Subjective:   Jesse Neal is a 54 y.o. very pleasant male patient who presents with the following: shoulder pain  The patient noted above presents with shoulder pain that has been ongoing for 3-4 mo there is no history of trauma or accident. The patient denies neck pain or radicular symptoms. No shoulder blade pain Denies dislocation, subluxation, separation of the shoulder. The patient does complain of pain with flexion, abduction, and terminal motion.  Significant restriction of motion. he describes a deep ache around the shoulder, and sometimes it will wake the patient up at night.  He presents with a 59-month history of right-sided shoulder pain.  I have seen the patient before back in 2018 with some lumbar radiculopathy.  He has done some repetitive motion, and he does fish quite a bit and he thinks that he aggravated this over the summer.  Bangs.  Has been trying to fis for a big bass.  Supervisor at a call center.    Medications Tried: tylenol or nsaids, no help Ice or Heat: minimal help Tried PT: No  Prior shoulder Injury: No Prior surgery: No Prior fracture: No   Review of Systems is noted in the HPI, as  appropriate  Objective:   Blood pressure 120/70, pulse 70, temperature 98.5 F (36.9 C), temperature source Temporal, height 5\' 8"  (1.727 m), weight 165 lb (74.8 kg), SpO2 97 %.   Shoulder: R and L Inspection: No muscle wasting or winging Ecchymosis/edema: neg  AC joint, scapula, clavicle: NT Cervical spine: NT, full ROM Spurling's: neg ABNORMAL SIDE TESTED: R UNLESS OTHERWISE NOTED, THE CONTRALATERAL SIDE HAS FULL RANGE OF MOTION. Abduction: 5/5, LIMITED TO 120 DEGREES Flexion: 5/5, LIMITED TO 160 DEGNO ROM  IR, lift-off: 5/5. TESTED AT 90 DEGREES OF ABDUCTION, LIMITED TO 10 DEGREES ER at neutral:  5/5, TESTED AT 90 DEGREES OF ABDUCTION, LIMITED TO 10 DEGREES AC crossover and compression: PAIN Drop Test: neg Empty Can: neg Supraspinatus insertion: NT Bicipital groove: NT ALL OTHER SPECIAL TESTING EQUIVOCAL GIVEN LOSS OF MOTION C5-T1 intact Sensation intact Grip 5/5   Assessment and Plan:     ICD-10-CM   1. Adhesive capsulitis of right shoulder  M75.01   2. Acute pain of right shoulder  M25.511     Patient was given a systematic ROM protocol from [facility] to be done daily. Emphasized importance of adherence, help of PT, daily HEP.  The average length of total symptoms is 12-18 months going through 3 different phases in the freezing and thawing process. There can be a permanent loss of motion in some cases.   Intraarticular shoulder injections discussed with patient, which have good evidence for accelerating the thawing phase, but he wants to wait.  Patient will be sent for formal  PT for aggressive frozen shoulder ROM if sx persist. Will need RTC str and scapular stabilization to fix underlying mechanics.  Follow-up: No follow-ups on file.  Signed,  Maud Deed. Earnstine Meinders, MD   Outpatient Encounter Medications as of 07/26/2020  Medication Sig  . acetaminophen (TYLENOL) 325 MG tablet Take 650 mg by mouth every 6 (six) hours as needed.  Marland Kitchen aspirin 81 MG EC tablet Take  by mouth. Baby aspirin  . clonazePAM (KLONOPIN) 0.5 MG tablet Take 0.5-1 tablets (0.25-0.5 mg total) by mouth daily as needed for anxiety.  Marland Kitchen escitalopram (LEXAPRO) 10 MG tablet Take 1 tablet (10 mg total) by mouth daily.  . fluticasone (FLONASE) 50 MCG/ACT nasal spray Place 2 sprays into both nostrils daily.   No facility-administered encounter medications on file as of 07/26/2020.

## 2020-07-26 ENCOUNTER — Ambulatory Visit (INDEPENDENT_AMBULATORY_CARE_PROVIDER_SITE_OTHER): Payer: BC Managed Care – PPO | Admitting: Family Medicine

## 2020-07-26 ENCOUNTER — Encounter: Payer: Self-pay | Admitting: Family Medicine

## 2020-07-26 ENCOUNTER — Other Ambulatory Visit: Payer: Self-pay

## 2020-07-26 VITALS — BP 120/70 | HR 70 | Temp 98.5°F | Ht 68.0 in | Wt 165.0 lb

## 2020-07-26 DIAGNOSIS — M7501 Adhesive capsulitis of right shoulder: Secondary | ICD-10-CM

## 2020-07-26 DIAGNOSIS — M25511 Pain in right shoulder: Secondary | ICD-10-CM | POA: Diagnosis not present

## 2020-07-27 NOTE — Telephone Encounter (Signed)
Added dates. Placed in [place].

## 2020-07-27 NOTE — Telephone Encounter (Signed)
Can you please add the dates and refax?

## 2020-07-28 NOTE — Telephone Encounter (Signed)
Messaged patient and informed of forms being refaxed and ready for pick up.  Copy for scan Copy for pt

## 2020-07-29 ENCOUNTER — Encounter: Payer: Self-pay | Admitting: Gastroenterology

## 2020-07-29 ENCOUNTER — Encounter: Payer: Self-pay | Admitting: Family Medicine

## 2020-07-29 ENCOUNTER — Ambulatory Visit (INDEPENDENT_AMBULATORY_CARE_PROVIDER_SITE_OTHER): Payer: BC Managed Care – PPO | Admitting: Gastroenterology

## 2020-07-29 ENCOUNTER — Other Ambulatory Visit: Payer: Self-pay

## 2020-07-29 VITALS — BP 131/72 | HR 71 | Ht 68.0 in | Wt 165.8 lb

## 2020-07-29 DIAGNOSIS — R634 Abnormal weight loss: Secondary | ICD-10-CM | POA: Diagnosis not present

## 2020-07-29 DIAGNOSIS — R109 Unspecified abdominal pain: Secondary | ICD-10-CM

## 2020-07-29 MED ORDER — OMEPRAZOLE 40 MG PO CPDR: 40.0000 mg | DELAYED_RELEASE_CAPSULE | Freq: Every day | ORAL | 1 refills | Status: DC

## 2020-07-29 MED ORDER — NA SULFATE-K SULFATE-MG SULF 17.5-3.13-1.6 GM/177ML PO SOLN: 1.0000 | Freq: Once | ORAL | 0 refills | Status: AC

## 2020-07-29 NOTE — Progress Notes (Signed)
Jesse Bellows MD, MRCP(U.K) [address]  [place]  [place], [place] [address]  Main: [phone]  Fax: [phone]   Primary Care Physician: Jesse Beck, FNP  Primary Gastroenterologist:  Dr. Jonathon Neal   Unintentional weight loss  HPI: Jesse Neal is a 54 y.o. male    Summary of history :  He was last seen at my office back in December 2018 for irritable bowel syndrome with constipation and diarrhea.  He has a history of IBS for over 20 years.  Symptoms of bloating, abdominal pain and heartburn.  On 04/30/2017 he had a colonoscopy that was normal..  Interval history    07/08/2020: Right upper quadrant ultrasound showed minimal gallbladder sludge. Referred back for unintentional weight loss and epigastric pain.  02/23/2020: Hemoglobin 14.9 g.  CMP normal.  TSH normal.  He has lost over 15 pounds of weight over the past 2 months.  Unable to eat.  He complains that he developed epigastric pain in the middle of September all of a sudden in the central part of the abdomen nonradiating which has been episodic since this occurred after an episode when he went jogging.  Not related to food intake.  Denies any NSAID use.  Only relieving factor is laying flat.  Denies any family history of GI tract cancers.  No hematemesis melena or rectal bleeding.  He is depressed because he cannot eat and he has been losing weight.  No other complaints.  No family history of thyroid problems.  Current Outpatient Medications  Medication Sig Dispense Refill  . acetaminophen (TYLENOL) 325 MG tablet Take 650 mg by mouth every 6 (six) hours as needed.    Marland Kitchen aspirin 81 MG EC tablet Take by mouth. Baby aspirin    . clonazePAM (KLONOPIN) 0.5 MG tablet Take 0.5-1 tablets (0.25-0.5 mg total) by mouth daily as needed for anxiety. 30 tablet 0  . escitalopram (LEXAPRO) 10 MG tablet Take 1 tablet (10 mg total) by mouth daily. 30 tablet 2  . fluticasone (FLONASE) 50 MCG/ACT nasal spray Place 2  sprays into both nostrils daily. 16 g 6   No current facility-administered medications for this visit.    Allergies as of 07/29/2020 - Review Complete 07/29/2020  Allergen Reaction Noted  . Bactrim [sulfamethoxazole-trimethoprim] Rash 08/04/2015    ROS:  General: Negative for anorexia, weight loss, fever, chills, fatigue, weakness. ENT: Negative for hoarseness, difficulty swallowing , nasal congestion. CV: Negative for chest pain, angina, palpitations, dyspnea on exertion, peripheral edema.  Respiratory: Negative for dyspnea at rest, dyspnea on exertion, cough, sputum, wheezing.  GI: See history of present illness. GU:  Negative for dysuria, hematuria, urinary incontinence, urinary frequency, nocturnal urination.  Endo: Negative for unusual weight change.    Physical Examination:   BP 131/72 (BP Location: Left Arm, Patient Position: Sitting, Cuff Size: Normal)   Pulse 71   Ht 5\' 8"  (1.727 m)   Wt 165 lb 12.8 oz (75.2 kg)   BMI 25.21 kg/m   General: Well-nourished, well-developed in no acute distress.  Eyes: No icterus. Conjunctivae pink. Mouth: Oropharyngeal mucosa moist and pink , no lesions erythema or exudate. Lungs: Clear to auscultation bilaterally. Non-labored. Heart: Regular rate and rhythm, no murmurs rubs or gallops.  Abdomen: Bowel sounds are normal, nontender, nondistended, no hepatosplenomegaly or masses, no abdominal bruits or hernia , no rebound or guarding.   Extremities: No lower extremity edema. No clubbing or deformities. Neuro: Alert and oriented x 3.  Grossly intact. Skin: Warm  and dry, no jaundice.   Psych: Alert and cooperative, normal mood and affect.   Imaging Studies: US Abdomen Limited RUQ (LIVER/GB)  Result Date: 07/10/2020 CLINICAL DATA:  Epigastric pain for 2 months EXAM: ULTRASOUND ABDOMEN LIMITED RIGHT UPPER QUADRANT COMPARISON:  04/01/2004 FINDINGS: Gallbladder: Minimal echogenic material within the gallbladder lumen compatible with sludge.  No shadowing gallstones. No evidence of acute cholecystitis. Common bile duct: Diameter: 8 mm. This is mildly prominent but unchanged since previous CT. Liver: No focal lesion identified. Within normal limits in parenchymal echogenicity. Portal vein is patent on color Doppler imaging with normal direction of blood flow towards the liver. Other: None. IMPRESSION: 1. Minimal gallbladder sludge. No evidence of cholelithiasis or cholecystitis. Electronically Signed   By: Randa Ngo M.D.   On: 07/10/2020 08:28    Assessment and Plan:   Jesse Neal is a 54 y.o. y/o male referred back to see me for unintentional weight loss and epigastric pain.   Plan 1.  EGD plus colonoscopy to be performed after Thanksgiving. 2.  CT scan of the abdomen and pelvis to evaluate for unintentional weight loss.  If all tests are negative will also require CT scan of the chest at his next appointment to complete evaluation for unintentional weight loss 3.  H. pylori breath test 4.  Prilosec 40 mg once a day.  I have discussed alternative options, risks & benefits,  which include, but are not limited to, bleeding, infection, perforation,respiratory complication & drug reaction.  The patient agrees with this plan & written consent will be obtained.      Dr Jesse Bellows  MD,MRCP [facility]) Follow up in 3 weeks

## 2020-07-29 NOTE — Addendum Note (Signed)
Addended by: Dorethea Clan on: 07/29/2020 03:43 PM   Modules accepted: Orders

## 2020-07-31 LAB — CBC
Hematocrit: 46.3 % (ref 37.5–51.0)
Hemoglobin: 16.1 g/dL (ref 13.0–17.7)
MCH: 31.2 pg (ref 26.6–33.0)
MCHC: 34.8 g/dL (ref 31.5–35.7)
MCV: 90 fL (ref 79–97)
Platelets: 227 10*3/uL (ref 150–450)
RBC: 5.16 x10E6/uL (ref 4.14–5.80)
RDW: 12.3 % (ref 11.6–15.4)
WBC: 7.4 10*3/uL (ref 3.4–10.8)

## 2020-07-31 LAB — H. PYLORI BREATH TEST: H pylori Breath Test: NEGATIVE

## 2020-08-02 ENCOUNTER — Encounter: Payer: Self-pay | Admitting: Gastroenterology

## 2020-08-19 ENCOUNTER — Telehealth: Payer: Self-pay

## 2020-08-19 NOTE — Telephone Encounter (Signed)
Returned patient's call. Patient requested to cancel procedure at this time. Pt states he is feeling better and will call back to reschedule. Informed Endo unit to cancel.

## 2020-08-23 ENCOUNTER — Ambulatory Visit: Payer: BC Managed Care – PPO

## 2020-08-27 ENCOUNTER — Ambulatory Visit: Admit: 2020-08-27 | Payer: BC Managed Care – PPO | Admitting: Gastroenterology

## 2020-08-27 SURGERY — COLONOSCOPY WITH PROPOFOL
Anesthesia: General

## 2020-09-27 ENCOUNTER — Ambulatory Visit: Payer: BC Managed Care – PPO | Admitting: Gastroenterology

## 2020-10-01 ENCOUNTER — Telehealth: Payer: Self-pay

## 2020-10-01 DIAGNOSIS — F411 Generalized anxiety disorder: Secondary | ICD-10-CM

## 2020-10-01 MED ORDER — ESCITALOPRAM OXALATE 10 MG PO TABS: 10.0000 mg | ORAL_TABLET | Freq: Every day | ORAL | 0 refills | Status: DC

## 2020-10-01 NOTE — Telephone Encounter (Signed)
Pharmacy requests refill on: Escitalopram 10 mg   LAST REFILL: 07/05/2020 (Q-30, R-2) LAST OV: 07/26/2020 NEXT OV: Not Scheduled  PHARMACY: [facility] [address], [place]

## 2020-10-07 NOTE — Telephone Encounter (Signed)
Appointment scheduled for Tuesday 2/8 with Dr. Danise Mina.

## 2020-10-11 ENCOUNTER — Other Ambulatory Visit: Payer: Self-pay

## 2020-10-11 ENCOUNTER — Encounter: Payer: Self-pay | Admitting: Family Medicine

## 2020-10-11 ENCOUNTER — Ambulatory Visit (INDEPENDENT_AMBULATORY_CARE_PROVIDER_SITE_OTHER): Payer: BC Managed Care – PPO | Admitting: Family Medicine

## 2020-10-11 VITALS — BP 110/80 | HR 75 | Temp 98.1°F | Ht 68.0 in | Wt 184.0 lb

## 2020-10-11 DIAGNOSIS — M7501 Adhesive capsulitis of right shoulder: Secondary | ICD-10-CM

## 2020-10-11 MED ORDER — TRIAMCINOLONE ACETONIDE 40 MG/ML IJ SUSP
40.0000 mg | Freq: Once | INTRAMUSCULAR | Status: AC
  Administered 2020-10-11: 40 mg via INTRA_ARTICULAR

## 2020-10-11 NOTE — Progress Notes (Signed)
    Eren Ryser T. Rozell Kettlewell, MD, [place]  Primary Care and [place] at [facility] [place], [address]  Phone: [phone]  FAX: [phone]  JOSETH WEIGEL - 55 y.o. male  MRN [id number]  Date of Birth: 1966/04/08  Date: 10/11/2020  PCP: Elby Beck, FNP (Inactive)  Referral: No ref. provider found  Chief Complaint  Patient presents with  . Shoulder Pain    Right-Injection    This visit occurred during the SARS-CoV-2 public health emergency.  Safety protocols were in place, including screening questions prior to the visit, additional usage of staff PPE, and extensive cleaning of exam room while observing appropriate contact time as indicated for disinfecting solutions.   Subjective:   Jesse Neal is a 55 y.o. very pleasant male patient with Body mass index is 27.98 kg/m. who presents with the following:  He does have frozen shoulder  Intraarticular Shoulder Aspiration/Injection Procedure Note XAIVIER MALAY [birth year] Date of procedure: 10/11/2020  Procedure: Large Joint Aspiration / Injection of Shoulder, Intraarticular, R Indications: Pain  Procedure Details Verbal consent was obtained from the patient. Risks explained and contrasted with benefits and alternatives. Patient prepped with Chloraprep and Ethyl Chloride used for anesthesia. An intraarticular shoulder injection was performed using the posterior approach; needle placed into joint capsule without difficulty. The patient tolerated the procedure well and had decreased pain post injection. No complications. Injection: 9 cc of Lidocaine 1% and 1 mL Kenalog 40 mg. Needle: 21 gauge, 2 inch  Signed,  Colsen Modi T. Jeriah Corkum, MD

## 2020-10-11 NOTE — Addendum Note (Signed)
Addended by: Carter Kitten on: 10/11/2020 03:31 PM   Modules accepted: Orders

## 2020-10-19 ENCOUNTER — Ambulatory Visit (INDEPENDENT_AMBULATORY_CARE_PROVIDER_SITE_OTHER): Payer: BC Managed Care – PPO | Admitting: Family Medicine

## 2020-10-19 ENCOUNTER — Other Ambulatory Visit: Payer: Self-pay

## 2020-10-19 ENCOUNTER — Encounter: Payer: Self-pay | Admitting: Family Medicine

## 2020-10-19 DIAGNOSIS — F411 Generalized anxiety disorder: Secondary | ICD-10-CM

## 2020-10-19 DIAGNOSIS — E041 Nontoxic single thyroid nodule: Secondary | ICD-10-CM | POA: Diagnosis not present

## 2020-10-19 DIAGNOSIS — R002 Palpitations: Secondary | ICD-10-CM

## 2020-10-19 DIAGNOSIS — R1013 Epigastric pain: Secondary | ICD-10-CM | POA: Diagnosis not present

## 2020-10-19 LAB — TSH: TSH: 2.81 u[IU]/mL (ref 0.35–4.50)

## 2020-10-19 LAB — T4, FREE: Free T4: 0.84 ng/dL (ref 0.60–1.60)

## 2020-10-19 MED ORDER — HYOSCYAMINE SULFATE 0.125 MG PO TABS: 0.1250 mg | ORAL_TABLET | Freq: Three times a day (TID) | ORAL | 1 refills | Status: DC | PRN

## 2020-10-19 MED ORDER — ESCITALOPRAM OXALATE 10 MG PO TABS: 10.0000 mg | ORAL_TABLET | Freq: Every day | ORAL | 3 refills | Status: DC

## 2020-10-19 NOTE — Progress Notes (Signed)
Patient ID: Jesse Neal, male    DOB: [birth year], 55 y.o.   MRN: [id number]  This visit was conducted in person.  BP 120/84 (BP Location: Left Arm, Patient Position: Sitting, Cuff Size: Normal)   Pulse 70   Temp 98.2 F (36.8 C) (Temporal)   Ht 5\' 8"  (1.727 m)   Wt 177 lb 2 oz (80.3 kg)   SpO2 99%   BMI 26.93 kg/m    CC: transfer care Subjective:   HPI: Jesse Neal is a 55 y.o. male presenting on 10/19/2020 for Transitions Of Care (TOC from [place].  Also, here for Lexapro f/u.)   Transfer of care from [place].  Recent GI eval Vicente Males) for epigastric pain associated with weight loss - started on prilosec 40mg  daily and planned EGD/colonoscopy but he called and cancelled as he started feeling better. Weight gain noted in the interim. RUQ US showed gallbladder sludge. H pylori breath test returned normal. No boring pain to the back.   Notes ongoing increased gassiness, bloating, indigestion. Normal BM 2x/day, soft formed stool. No pale stools or increased buoyancy. No blood in stool or urine. no nausea/vomiting. No significant GERD - manages with PRN omeprazole. Has tried beano without benefit. Endorses longstanding h/o IBS. He has eliminated wheat, soy, dairy from diet. He takes daily probiotic. Noted 20 lb weight fluctuation over the last 3 months.   GAD with anxiety attacks started 05/2020 - on lexapro 10mg  daily since 07/11/2020 along with PRN clonazepam with benefit. Anxiety attacks described as epigastric stomach pain, heart racing pulse up to 140-150. Initially noted after running (2-15 minutes duration), then started having more frequent attacks not associated with running - 80% better since starting lexapro. He attributes recent weight loss then weight gain to anxiety. Notes ongoing lethargy but without fatigue - has been unable to get back into running. He is seeing counselor.   He did have cardiac evaluation for palpitations with normal echocardiogram, stress test and  benign heart monitor 05/2020 (Dr Nehemiah Massed).   Holter 05/2020: Baseline normal sinus rhythm with maximum heart rate of 141 bpm minimum 40 bpm average of 66 bpm. There is occasional preatrial contractions and 1 occurrence of supraventricular tachycardia lasting 3 beats. This was apparently asymptomatic. Heart rate was variable depending on activity levels with no evidence of malignant tachycardia symptomatic bradycardia advanced heart block. There was no diary entry.  Frozen right shoulder - has seen Dr Lorelei Pont s/p steroid shot, rec PT, HEP. Feels lidocaine patches have helped.   Chronic cold intolerance, more recently with "hot flashes". This is temporally related to when he started lidocaine patch use.   Last CPE 02/2020      Relevant past medical, surgical, family and social history reviewed and updated as indicated. Interim medical history since our last visit reviewed. Allergies and medications reviewed and updated. Outpatient Medications Prior to Visit  Medication Sig Dispense Refill  . acetaminophen (TYLENOL) 325 MG tablet Take 650 mg by mouth every 6 (six) hours as needed.    Marland Kitchen aspirin 81 MG EC tablet Take by mouth. Baby aspirin    . clonazePAM (KLONOPIN) 0.5 MG tablet Take 0.5-1 tablets (0.25-0.5 mg total) by mouth daily as needed for anxiety. 30 tablet 0  . fluticasone (FLONASE) 50 MCG/ACT nasal spray Place 2 sprays into both nostrils daily. 16 g 6  . escitalopram (LEXAPRO) 10 MG tablet Take 1 tablet (10 mg total) by mouth daily. 90 tablet 0   No facility-administered medications prior to visit.  Per HPI unless specifically indicated in ROS section below Review of Systems Objective:  BP 120/84 (BP Location: Left Arm, Patient Position: Sitting, Cuff Size: Normal)   Pulse 70   Temp 98.2 F (36.8 C) (Temporal)   Ht 5\' 8"  (1.727 m)   Wt 177 lb 2 oz (80.3 kg)   SpO2 99%   BMI 26.93 kg/m   Wt Readings from Last 3 Encounters:  10/19/20 177 lb 2 oz (80.3 kg)  10/11/20 184 lb  (83.5 kg)  07/29/20 165 lb 12.8 oz (75.2 kg)      Physical Exam Vitals and nursing note reviewed.  Constitutional:      Appearance: Normal appearance. He is not ill-appearing.  Neck:     Thyroid: Thyroid mass (?r thyroid nodule) present. No thyromegaly.  Cardiovascular:     Rate and Rhythm: Normal rate and regular rhythm.     Pulses: Normal pulses.     Heart sounds: Normal heart sounds. No murmur heard.   Pulmonary:     Effort: Pulmonary effort is normal. No respiratory distress.     Breath sounds: Normal breath sounds. No wheezing, rhonchi or rales.  Abdominal:     General: Abdomen is flat. Bowel sounds are normal. There is no distension.     Palpations: Abdomen is soft. There is no mass.     Tenderness: There is abdominal tenderness (mild) in the epigastric area. There is no right CVA tenderness, left CVA tenderness, guarding or rebound. Negative signs include Murphy's sign.     Hernia: No hernia is present. There is no hernia in the umbilical area.  Musculoskeletal:     Cervical back: Normal range of motion and neck supple.     Right lower leg: No edema.     Left lower leg: No edema.  Skin:    General: Skin is warm and dry.     Findings: No rash.  Neurological:     Mental Status: He is alert.  Psychiatric:        Mood and Affect: Mood normal.        Behavior: Behavior normal.       Results for orders placed or performed in visit on 10/19/20  TSH  Result Value Ref Range   TSH 2.81 0.35 - 4.50 uIU/mL  T4, free  Result Value Ref Range   Free T4 0.84 0.60 - 1.60 ng/dL   Lab Results  Component Value Date   CREATININE 0.95 02/23/2020   BUN 16 02/23/2020   NA 138 02/23/2020   K 4.3 02/23/2020   CL 102 02/23/2020   CO2 30 02/23/2020    Lab Results  Component Value Date   ALT 19 02/23/2020   AST 22 02/23/2020   ALKPHOS 62 02/23/2020   BILITOT 0.4 02/23/2020    Lab Results  Component Value Date   WBC 7.4 07/29/2020   HGB 16.1 07/29/2020   HCT 46.3  07/29/2020   MCV 90 07/29/2020   PLT 227 07/29/2020   LIMITED RUQ ABD US IMPRESSION: 1. Minimal gallbladder sludge. No evidence of cholelithiasis or cholecystitis. Electronically Signed   By: Randa Ngo M.D.   On: 07/10/2020 08:28  Depression screen [facility] 2/9 10/19/2020 07/21/2020 06/25/2020 02/23/2020 04/14/2019  Decreased Interest 2 1 3  0 0  Down, Depressed, Hopeless 1 2 2  0 0  PHQ - 2 Score 3 3 5  0 0  Altered sleeping 2 2 0 - -  Tired, decreased energy 1 2 1  - -  Change in appetite  0 1 0 - -  Feeling bad or failure about yourself  1 2 0 - -  Trouble concentrating 0 2 2 - -  Moving slowly or fidgety/restless 1 0 0 - -  Suicidal thoughts 0 0 0 - -  PHQ-9 Score 8 12 8  - -  Difficult doing work/chores - Very difficult Extremely dIfficult - -    GAD 7 : Generalized Anxiety Score 10/19/2020 07/21/2020 06/25/2020  Nervous, Anxious, on Edge 2 3 3   Control/stop worrying 0 2 2  Worry too much - different things 0 2 3  Trouble relaxing 3 3 3   Restless 0 1 1  Easily annoyed or irritable 1 1 3   Afraid - awful might happen 0 3 3  Total GAD 7 Score 6 15 18   Anxiety Difficulty - - Very difficult   Assessment & Plan:  This visit occurred during the SARS-CoV-2 public health emergency.  Safety protocols were in place, including screening questions prior to the visit, additional usage of staff PPE, and extensive cleaning of exam room while observing appropriate contact time as indicated for disinfecting solutions.   Problem List Items Addressed This Visit    Thyroid nodule    Possible R thyroid nodule - check TFTs. If persistent, consider thyroid imaging.       Palpitations    Reassuring cardiac evaluation last year Nehemiah Massed) reviewed with patient. Consider beta blocker PRN.       GAD (generalized anxiety disorder)    Longstanding h/o GAD with anxiety attacks, recently started lexapro with benefit - will refill. Also using sparing klonopin - ok to continue this. Improvement noted in  PHQ9/GAD7. Notes increased lethargy which could be mood related. Doesn't describe decreased energy/fatigue.       Relevant Medications   escitalopram (LEXAPRO) 10 MG tablet   Epigastric pain    With increased gassiness, bloating, indigestion. Initially with associated weight loss but subsequently weight gain noted with better anxiety control on lexapro. He cancelled further GI evaluation. Likely GI symptoms exacerbated by stress/anxiety. Endorses h/o IBS - possible exacerbation in setting of anxiety - rec continue lactose free diet, discussed probiotic use, increased fiber and water in diet, and avoiding gas producing foods. Rx levsin PRN cramping. Consider trial IBGard. Try gasX for gassiness. Check labs today.  RTC 2 mo f/u visit, 6 mo CPE.           Meds ordered this encounter  Medications  . escitalopram (LEXAPRO) 10 MG tablet    Sig: Take 1 tablet (10 mg total) by mouth daily.    Dispense:  90 tablet    Refill:  3  . hyoscyamine (LEVSIN) 0.125 MG tablet    Sig: Take 1 tablet (0.125 mg total) by mouth 3 (three) times daily as needed for cramping.    Dispense:  30 tablet    Refill:  1   Orders Placed This Encounter  Procedures  . TSH  . T4, free    Patient Instructions  Labs today.  Lexapro refilled.  Try GasX for gassiness.  Increase fiber and water.  Exclude gas producing foods (beans, onions, celery, carrots, raisins, bananas, apricots, prunes, brussel sprouts, wheat germ, pretzels) Continue lactose free diet (back off milk)  Trial of align for bloating/IBS symptoms (OTC). Or continue your daily probiotic.  Levsin for pain as needed.  If helping, may use preventatively prior to meals.  Return in 2 months to touch base on how you're doing.  Return in 6 months for physical.  Follow up plan: Return in about 3 months (around 01/16/2021), or if symptoms worsen or fail to improve, for annual exam, prior fasting for blood work, follow up visit.  Ria Bush, MD

## 2020-10-19 NOTE — Patient Instructions (Addendum)
Labs today.  Lexapro refilled.  Try GasX for gassiness.  Increase fiber and water.  Exclude gas producing foods (beans, onions, celery, carrots, raisins, bananas, apricots, prunes, brussel sprouts, wheat germ, pretzels) Continue lactose free diet (back off milk)  Trial of align for bloating/IBS symptoms (OTC). Or continue your daily probiotic.  Levsin for pain as needed.  If helping, may use preventatively prior to meals.  Return in 2 months to touch base on how you're doing.  Return in 6 months for physical.

## 2020-10-21 DIAGNOSIS — R1013 Epigastric pain: Secondary | ICD-10-CM | POA: Insufficient documentation

## 2020-10-21 DIAGNOSIS — E041 Nontoxic single thyroid nodule: Secondary | ICD-10-CM | POA: Insufficient documentation

## 2020-10-21 NOTE — Assessment & Plan Note (Signed)
With increased gassiness, bloating, indigestion. Initially with associated weight loss but subsequently weight gain noted with better anxiety control on lexapro. He cancelled further GI evaluation. Likely GI symptoms exacerbated by stress/anxiety. Endorses h/o IBS - possible exacerbation in setting of anxiety - rec continue lactose free diet, discussed probiotic use, increased fiber and water in diet, and avoiding gas producing foods. Rx levsin PRN cramping. Consider trial IBGard. Try gasX for gassiness. Check labs today.  RTC 2 mo f/u visit, 6 mo CPE.

## 2020-10-21 NOTE — Assessment & Plan Note (Signed)
Reassuring cardiac evaluation last year Jesse Neal) reviewed with patient. Consider beta blocker PRN.

## 2020-10-21 NOTE — Assessment & Plan Note (Signed)
Possible R thyroid nodule - check TFTs. If persistent, consider thyroid imaging.

## 2020-10-21 NOTE — Assessment & Plan Note (Addendum)
Longstanding h/o GAD with anxiety attacks, recently started lexapro with benefit - will refill. Also using sparing klonopin - ok to continue this. Improvement noted in PHQ9/GAD7. Notes increased lethargy which could be mood related. Doesn't describe decreased energy/fatigue.

## 2020-11-01 ENCOUNTER — Telehealth: Payer: Self-pay

## 2020-11-01 DIAGNOSIS — J310 Chronic rhinitis: Secondary | ICD-10-CM

## 2020-11-01 MED ORDER — FLUTICASONE PROPIONATE 50 MCG/ACT NA SUSP: 2.0000 | Freq: Every day | NASAL | 5 refills | Status: DC

## 2020-11-01 NOTE — Telephone Encounter (Signed)
Pharmacy requests refill on: Fluticasone 50 mcg Nasal Spray   LAST REFILL: 02/23/2020 (Q-16 g, R-6) LAST OV: 10/19/2020 NEXT OV: Not Scheduled  PHARMACY: [id number] [place], [place]

## 2020-11-07 ENCOUNTER — Other Ambulatory Visit: Payer: Self-pay | Admitting: Family Medicine

## 2020-11-08 NOTE — Telephone Encounter (Signed)
Refill request Levsin Last office visiit 10/19/20 Last refill 10/19/20

## 2020-11-26 ENCOUNTER — Other Ambulatory Visit: Payer: Self-pay | Admitting: Family Medicine

## 2021-01-26 ENCOUNTER — Ambulatory Visit (INDEPENDENT_AMBULATORY_CARE_PROVIDER_SITE_OTHER): Payer: BC Managed Care – PPO | Admitting: Family Medicine

## 2021-01-26 ENCOUNTER — Other Ambulatory Visit: Payer: Self-pay

## 2021-01-26 ENCOUNTER — Encounter: Payer: Self-pay | Admitting: Family Medicine

## 2021-01-26 VITALS — BP 120/84 | HR 68 | Temp 98.0°F | Ht 68.0 in | Wt 183.0 lb

## 2021-01-26 DIAGNOSIS — M25511 Pain in right shoulder: Secondary | ICD-10-CM

## 2021-01-26 DIAGNOSIS — R1013 Epigastric pain: Secondary | ICD-10-CM | POA: Diagnosis not present

## 2021-01-26 DIAGNOSIS — J302 Other seasonal allergic rhinitis: Secondary | ICD-10-CM | POA: Diagnosis not present

## 2021-01-26 DIAGNOSIS — F411 Generalized anxiety disorder: Secondary | ICD-10-CM | POA: Diagnosis not present

## 2021-01-26 MED ORDER — ESCITALOPRAM OXALATE 20 MG PO TABS: 20.0000 mg | ORAL_TABLET | Freq: Every day | ORAL | 3 refills | Status: DC

## 2021-01-26 MED ORDER — ACETAMINOPHEN-CODEINE #3 300-30 MG PO TABS: 1.0000 | ORAL_TABLET | Freq: Three times a day (TID) | ORAL | 0 refills | Status: DC | PRN

## 2021-01-26 MED ORDER — CLONAZEPAM 0.5 MG PO TABS: 0.2500 mg | ORAL_TABLET | Freq: Every day | ORAL | 0 refills | Status: DC | PRN

## 2021-01-26 MED ORDER — FLUTICASONE PROPIONATE 50 MCG/ACT NA SUSP: 2.0000 | Freq: Every day | NASAL | 5 refills | Status: DC

## 2021-01-26 NOTE — Patient Instructions (Addendum)
For R shoulder - retry lidocaine patches. Continue tylenol. May take tylenol #3 (with codeine) for breakthrough pain. Do exercises provided today for possible biceps tendonitis.  We will increase lexapro to 20mg  daily. Let us know how you do with this.  Try IBGard peppermint capsules for GI symptoms.

## 2021-01-26 NOTE — Progress Notes (Signed)
Patient ID: Jesse Neal, male    DOB: 1966-02-27, 55 y.o.   MRN: [id number]  This visit was conducted in person.  BP 120/84   Pulse 68   Temp 98 F (36.7 C) (Temporal)   Ht 5\' 8"  (1.727 m)   Wt 183 lb (83 kg)   SpO2 98%   BMI 27.83 kg/m    CC: f/u anxiety, R shoulder pain  Subjective:   HPI: Jesse Neal is a 55 y.o. male presenting on 01/26/2021 for Anxiety (Here for 2 mo f/u.) and Shoulder Pain (C/o right shoulder pain.  Seen Dr. Lorelei Pont previously for inj, helpful.  Pt reinjured  shoulder in 5 mos ago.  Requests pain med. )   See prior note for details.  Recently lost his job at [employer].  Upcoming trip to [facility] with brothers for golf/pickleball trip.  GAD with anxiety attacks since 05/2020, treating with lexapro 10mg  daily and klonopin 0.5mg  daily prn. Continued trouble with anxiety. Has stopped seeing counselor. Still unable to work out due to concerns over palpitations.   H/o frozen R shoulder - saw Dr Lorelei Pont 09/2020 s/p steroid injection, PT, continued HEP. Re-injured shoulder while taking off tractor tire. Lidocaine patches were also helpful. Treating with tylenol and CBD oil.   Epigastric discomfort - he cancelled further GI evaluation. ?IBS - last visit we started levsin PRN cramping (didn't help), discussed PRN GasX use. He continues to feel symptoms are largely stress related. He finds klonopin helps settle stomach - taking 1/2 tab about 3 times a week. No significant GERD symptoms.      Relevant past medical, surgical, family and social history reviewed and updated as indicated. Interim medical history since our last visit reviewed. Allergies and medications reviewed and updated. Outpatient Medications Prior to Visit  Medication Sig Dispense Refill  . acetaminophen (TYLENOL) 325 MG tablet Take 650 mg by mouth every 6 (six) hours as needed.    . clonazePAM (KLONOPIN) 0.5 MG tablet Take 0.5-1 tablets (0.25-0.5 mg total) by mouth daily as needed for anxiety. 30  tablet 0  . escitalopram (LEXAPRO) 10 MG tablet Take 1 tablet (10 mg total) by mouth daily. 90 tablet 3  . fluticasone (FLONASE) 50 MCG/ACT nasal spray Place 2 sprays into both nostrils daily. 16 g 5  . aspirin 81 MG EC tablet Take by mouth. Baby aspirin    . hyoscyamine (LEVSIN) 0.125 MG tablet TAKE 1 TABLET(0.125 MG) BY MOUTH THREE TIMES DAILY AS NEEDED FOR CRAMPING 30 tablet 1   No facility-administered medications prior to visit.     Per HPI unless specifically indicated in ROS section below Review of Systems Objective:  BP 120/84   Pulse 68   Temp 98 F (36.7 C) (Temporal)   Ht 5\' 8"  (1.727 m)   Wt 183 lb (83 kg)   SpO2 98%   BMI 27.83 kg/m   Wt Readings from Last 3 Encounters:  01/26/21 183 lb (83 kg)  10/19/20 177 lb 2 oz (80.3 kg)  10/11/20 184 lb (83.5 kg)      Physical Exam Vitals and nursing note reviewed.  Constitutional:      Appearance: Normal appearance. He is not ill-appearing.  Cardiovascular:     Rate and Rhythm: Normal rate and regular rhythm.     Pulses: Normal pulses.     Heart sounds: Normal heart sounds. No murmur heard.   Pulmonary:     Effort: Pulmonary effort is normal. No respiratory distress.  Breath sounds: Normal breath sounds. No wheezing, rhonchi or rales.  Musculoskeletal:        General: Tenderness present. Normal range of motion.     Right lower leg: No edema.     Left lower leg: No edema.     Comments:  L shoulder WNL R shoulder exam: No deformity of shoulders on inspection. Discomfort with palpation of anterior shoulder into biceps tendon Limited ROM in forward flexion due to discomfort, improved with passive ROM. No pain or weakness with testing SITS in ext/int rotation. No pain with empty can sign. Pain with Speed test. No significant impingement. No pain with rotation of humeral head in [facility] joint.   Skin:    General: Skin is warm and dry.     Findings: No rash.  Neurological:     Mental Status: He is alert.   Psychiatric:        Mood and Affect: Mood normal.        Behavior: Behavior normal.       Results for orders placed or performed in visit on 10/19/20  TSH  Result Value Ref Range   TSH 2.81 0.35 - 4.50 uIU/mL  T4, free  Result Value Ref Range   Free T4 0.84 0.60 - 1.60 ng/dL   Lab Results  Component Value Date   WBC 7.4 07/29/2020   HGB 16.1 07/29/2020   HCT 46.3 07/29/2020   MCV 90 07/29/2020   PLT 227 07/29/2020    Lab Results  Component Value Date   CREATININE 0.95 02/23/2020   BUN 16 02/23/2020   NA 138 02/23/2020   K 4.3 02/23/2020   CL 102 02/23/2020   CO2 30 02/23/2020    Lab Results  Component Value Date   ALT 19 02/23/2020   AST 22 02/23/2020   ALKPHOS 62 02/23/2020   BILITOT 0.4 02/23/2020   Depression screen PHQ 2/9 10/19/2020 07/21/2020 06/25/2020 02/23/2020 04/14/2019  Decreased Interest 2 1 3  0 0  Down, Depressed, Hopeless 1 2 2  0 0  PHQ - 2 Score 3 3 5  0 0  Altered sleeping 2 2 0 - -  Tired, decreased energy 1 2 1  - -  Change in appetite 0 1 0 - -  Feeling bad or failure about yourself  1 2 0 - -  Trouble concentrating 0 2 2 - -  Moving slowly or fidgety/restless 1 0 0 - -  Suicidal thoughts 0 0 0 - -  PHQ-9 Score 8 12 8  - -  Difficult doing work/chores - Very difficult Extremely dIfficult - -    GAD 7 : Generalized Anxiety Score 10/19/2020 07/21/2020 06/25/2020  Nervous, Anxious, on Edge 2 3 3   Control/stop worrying 0 2 2  Worry too much - different things 0 2 3  Trouble relaxing 3 3 3   Restless 0 1 1  Easily annoyed or irritable 1 1 3   Afraid - awful might happen 0 3 3  Total GAD 7 Score 6 15 18   Anxiety Difficulty - - Very difficult   Assessment & Plan:  This visit occurred during the SARS-CoV-2 public health emergency.  Safety protocols were in place, including screening questions prior to the visit, additional usage of staff PPE, and extensive cleaning of exam room while observing appropriate contact time as indicated for disinfecting  solutions.   Problem List Items Addressed This Visit    GAD (generalized anxiety disorder) - Primary    Ongoing struggle with anxiety attacks.  Desires trial of  higher lexapro dose which is appropriate.  Klonopin refilled to use PRN. Discussed not mixing with tylenol #3.  Update [place] with effect of above.       Relevant Medications   clonazePAM (KLONOPIN) 0.5 MG tablet   escitalopram (LEXAPRO) 20 MG tablet   Allergic rhinitis    flonase refilled.       Relevant Medications   fluticasone (FLONASE) 50 MCG/ACT nasal spray   Epigastric pain    Ongoing symptoms - levsin ineffective, IBGard hasn't really helped either. Previous GI workup revealed normal H pylori testing. Discussed return to GI for planned EGD if ongoing symptoms.      Right shoulder pain    Anticipate biceps tendonitis given exam. Discussed tylenol use, provided with stretching exercises from [facility] patient advisor, provided tylenol #3 for breakthrough pain with instructions on correct use.  [place] CSRS reviewed.           Meds ordered this encounter  Medications  . clonazePAM (KLONOPIN) 0.5 MG tablet    Sig: Take 0.5-1 tablets (0.25-0.5 mg total) by mouth daily as needed for anxiety.    Dispense:  30 tablet    Refill:  0  . fluticasone (FLONASE) 50 MCG/ACT nasal spray    Sig: Place 2 sprays into both nostrils daily.    Dispense:  16 g    Refill:  5  . acetaminophen-codeine (TYLENOL #3) 300-30 MG tablet    Sig: Take 1 tablet by mouth 3 (three) times daily as needed for moderate pain.    Dispense:  15 tablet    Refill:  0  . escitalopram (LEXAPRO) 20 MG tablet    Sig: Take 1 tablet (20 mg total) by mouth daily.    Dispense:  90 tablet    Refill:  3   No orders of the defined types were placed in this encounter.   Patient Instructions  For R shoulder - retry lidocaine patches. Continue tylenol. May take tylenol #3 (with codeine) for breakthrough pain. Do exercises provided today for possible biceps tendonitis.   We will increase lexapro to 20mg  daily. Let us know how you do with this.  Try IBGard peppermint capsules for GI symptoms.    Follow up plan: Return if symptoms worsen or fail to improve.  Ria Bush, MD

## 2021-01-26 NOTE — Assessment & Plan Note (Signed)
flonase refilled.

## 2021-01-26 NOTE — Assessment & Plan Note (Signed)
Ongoing struggle with anxiety attacks.  Desires trial of higher lexapro dose which is appropriate.  Klonopin refilled to use PRN. Discussed not mixing with tylenol #3.  Update [place] with effect of above.

## 2021-01-26 NOTE — Assessment & Plan Note (Signed)
Ongoing symptoms - levsin ineffective, IBGard hasn't really helped either. Previous GI workup revealed normal H pylori testing. Discussed return to GI for planned EGD if ongoing symptoms.

## 2021-01-26 NOTE — Assessment & Plan Note (Signed)
Anticipate biceps tendonitis given exam. Discussed tylenol use, provided with stretching exercises from [facility] patient advisor, provided tylenol #3 for breakthrough pain with instructions on correct use.  [place] CSRS reviewed.

## 2021-02-27 IMAGING — US US ABDOMEN LIMITED
1 series · 14 of 25 positions shown · non-contrast
Comparison: 04/01/2004

CLINICAL DATA: Epigastric pain for 2 months

EXAM:
ULTRASOUND ABDOMEN LIMITED RIGHT UPPER QUADRANT

[Series 1: us abdomen limited · 0.20mm/px · 14 of 70 slices shown]
[im 1/70]
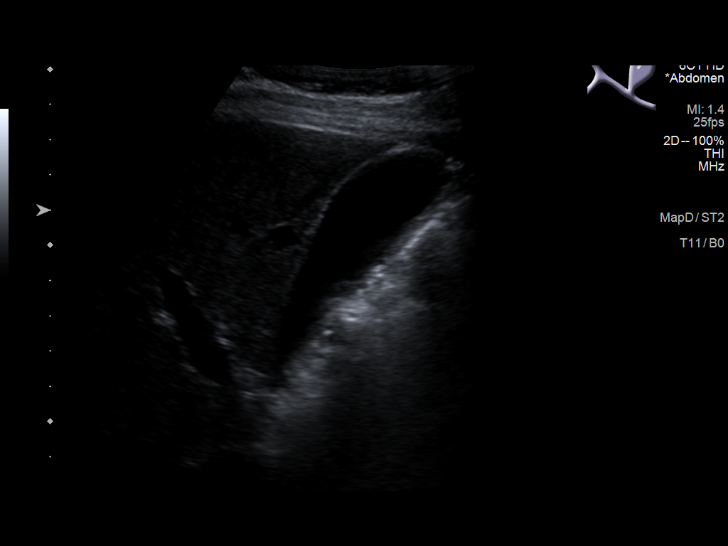
[im 6/70]
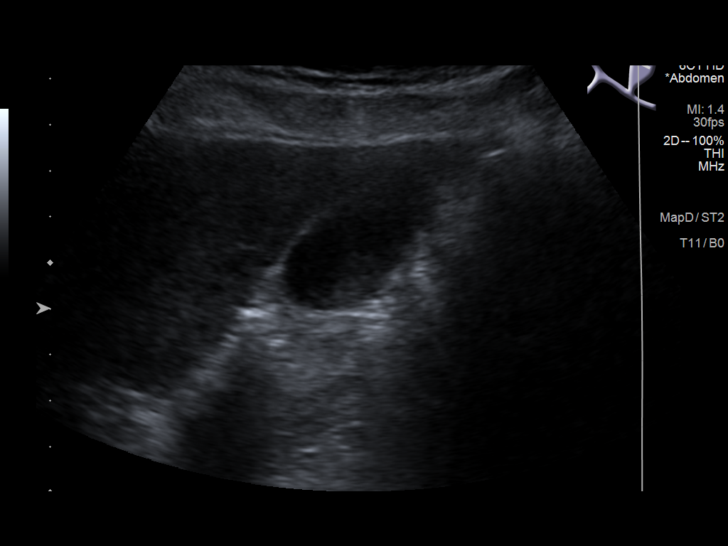
[im 12/70]
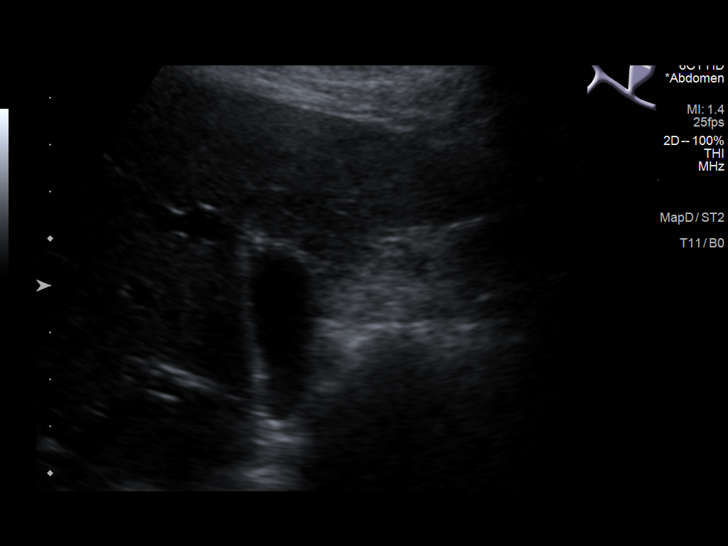
[im 18/70]
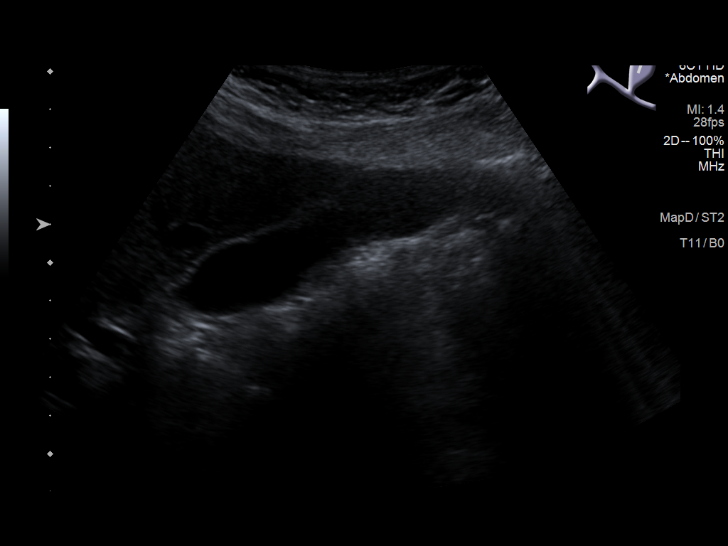
[im 24/70]
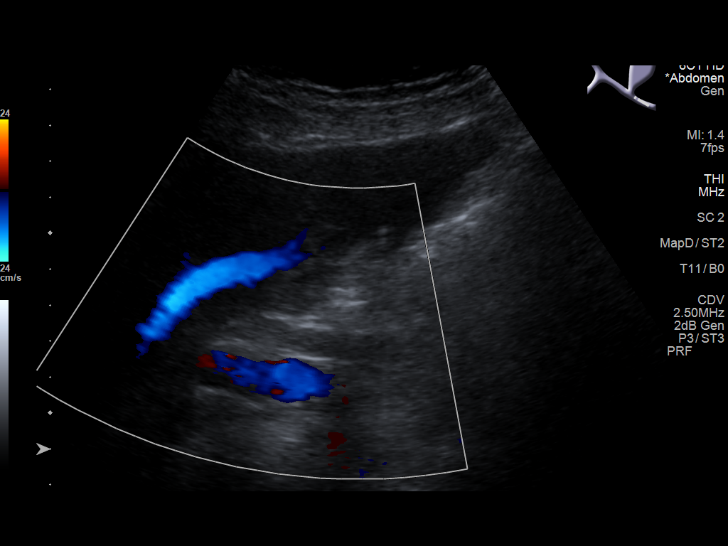
[im 26/70]
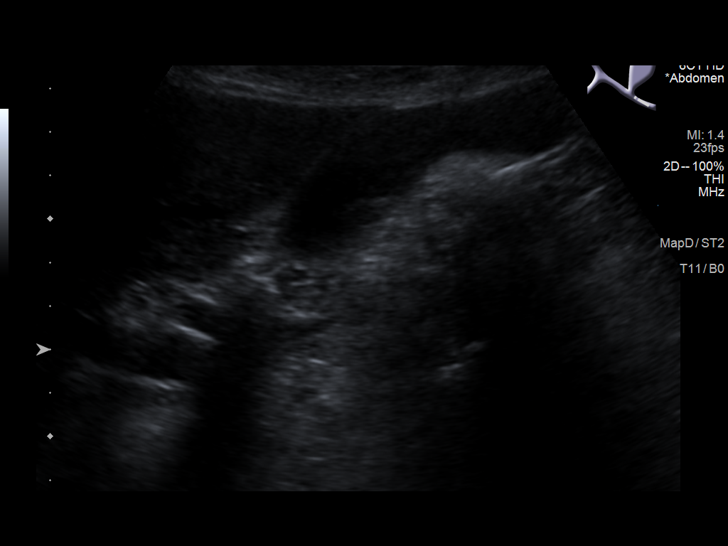
[im 32/70]
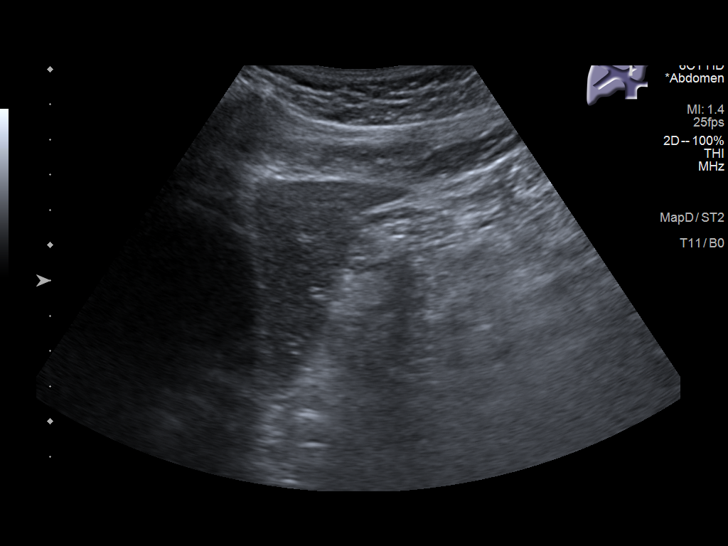
[im 38/70]
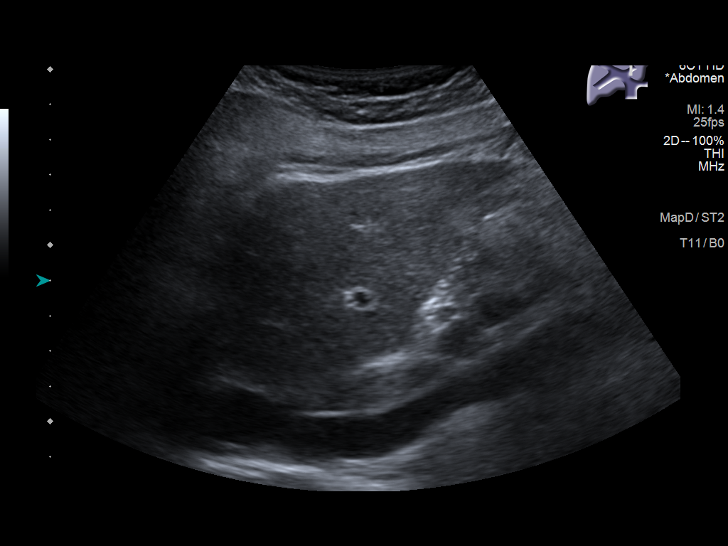
[im 44/70]
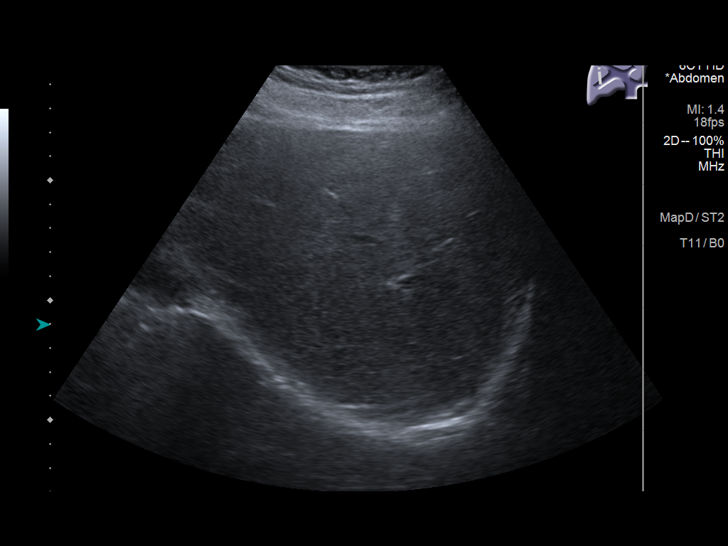
[im 47/70]
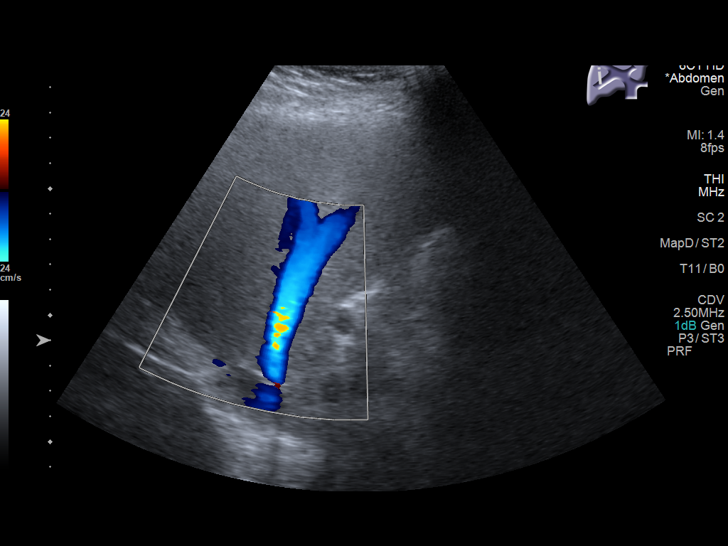
[im 52/70]
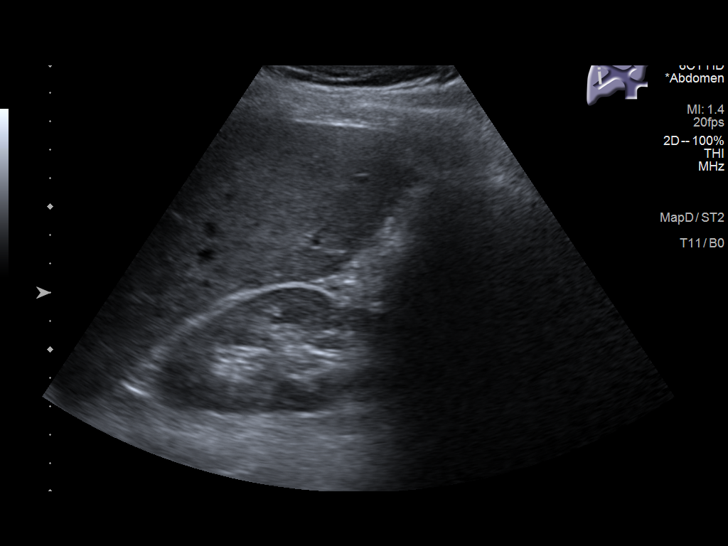
[im 58/70]
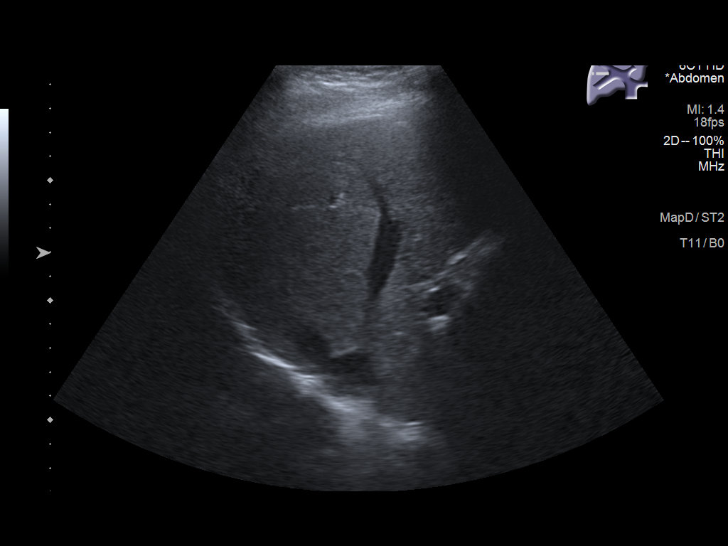
[im 64/70]
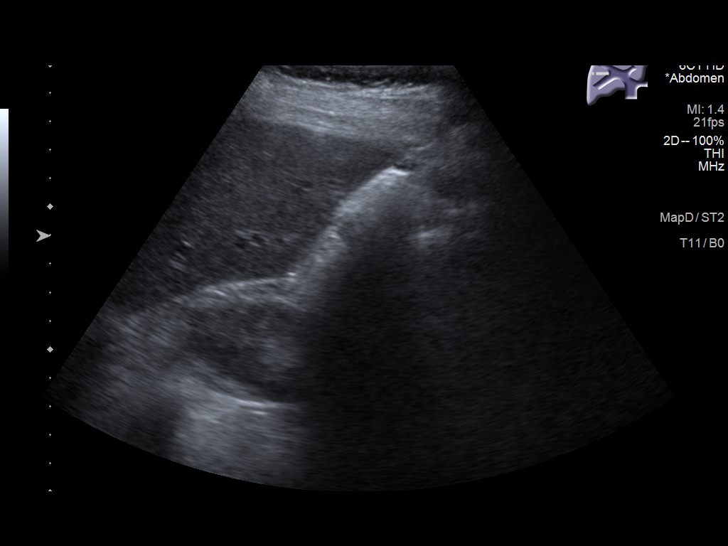
[im 70/70]
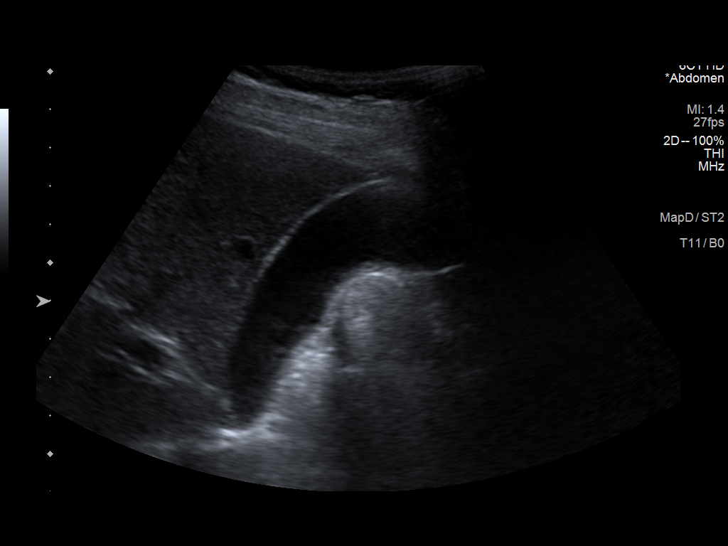

[14 of 25 positions shown; findings below may reference images not displayed]

FINDINGS: Gallbladder:

Minimal echogenic material within the gallbladder lumen compatible
with sludge. No shadowing gallstones. No evidence of acute
cholecystitis.

Common bile duct:

Diameter: 8 mm. This is mildly prominent but unchanged since
previous CT.

Liver:

No focal lesion identified. Within normal limits in parenchymal
echogenicity. Portal vein is patent on color Doppler imaging with
normal direction of blood flow towards the liver.

Other: None.
IMPRESSION: 1. Minimal gallbladder sludge. No evidence of cholelithiasis or
cholecystitis.

## 2021-06-22 ENCOUNTER — Other Ambulatory Visit: Payer: Self-pay | Admitting: Family Medicine

## 2021-06-22 DIAGNOSIS — F411 Generalized anxiety disorder: Secondary | ICD-10-CM

## 2021-06-24 ENCOUNTER — Encounter: Payer: Self-pay | Admitting: Family Medicine

## 2021-06-24 DIAGNOSIS — F411 Generalized anxiety disorder: Secondary | ICD-10-CM

## 2021-06-24 MED ORDER — CLONAZEPAM 0.5 MG PO TABS: 0.2500 mg | ORAL_TABLET | Freq: Every day | ORAL | 0 refills | Status: DC | PRN

## 2021-06-24 NOTE — Telephone Encounter (Signed)
Refill request Clonazepam Last refill 01/26/21 #30 Last office visit 01/26/21 No upcoming appointment scheduled

## 2021-06-24 NOTE — Telephone Encounter (Signed)
ERx 

## 2021-06-27 MED ORDER — CLONAZEPAM 0.5 MG PO TABS: 0.2500 mg | ORAL_TABLET | Freq: Every day | ORAL | 0 refills | Status: DC | PRN

## 2021-09-16 ENCOUNTER — Encounter: Payer: Self-pay | Admitting: Family Medicine

## 2022-02-20 ENCOUNTER — Ambulatory Visit: Payer: 59 | Admitting: Family Medicine

## 2022-02-20 ENCOUNTER — Encounter: Payer: Self-pay | Admitting: Family Medicine

## 2022-02-20 VITALS — BP 122/90 | HR 66 | Temp 98.3°F | Ht 68.0 in | Wt 179.1 lb

## 2022-02-20 DIAGNOSIS — R0989 Other specified symptoms and signs involving the circulatory and respiratory systems: Secondary | ICD-10-CM

## 2022-02-20 DIAGNOSIS — J302 Other seasonal allergic rhinitis: Secondary | ICD-10-CM

## 2022-02-20 DIAGNOSIS — F411 Generalized anxiety disorder: Secondary | ICD-10-CM

## 2022-02-20 DIAGNOSIS — R002 Palpitations: Secondary | ICD-10-CM

## 2022-02-20 MED ORDER — CLONAZEPAM 0.5 MG PO TABS: 0.2500 mg | ORAL_TABLET | Freq: Every day | ORAL | 0 refills | Status: DC | PRN

## 2022-02-20 MED ORDER — MONTELUKAST SODIUM 10 MG PO TABS: 10.0000 mg | ORAL_TABLET | Freq: Every day | ORAL | 3 refills | Status: AC

## 2022-02-20 MED ORDER — BUSPIRONE HCL 7.5 MG PO TABS: 7.5000 mg | ORAL_TABLET | Freq: Two times a day (BID) | ORAL | 3 refills | Status: AC

## 2022-02-20 NOTE — Progress Notes (Signed)
Patient ID: Jesse Neal, male    DOB: 07/18/1966, 56 y.o.   MRN: [id number]  This visit was conducted in person.  BP 122/90   Pulse 66   Temp 98.3 F (36.8 C) (Temporal)   Ht '5\' 8"'$  (1.727 m)   Wt 179 lb 2 oz (81.3 kg)   SpO2 97%   BMI 27.24 kg/m    CC: f/u anxiety, discuss throat difficulty Subjective:   HPI: Jesse Neal is a 56 y.o. male presenting on 02/20/2022 for Throat Problem (C/o feeling of something in the throat. Denies pain or trouble swallowing. Started about 3 wks ago.  Usually worse in the AM. ) and Anxiety (Here for f/u.)   Anxiety - continues klonopin 0.'5mg'$  PRN. Off lexapro '20mg'$  - higher dose worsened anxiety. Daily issue - excessive worrying, difficulty relaxing. Stress relieving strategy - exercise, hasn't been doing this recently.   H/o SVT/palpitations on metoprolol '25mg'$  bid.   3 wk h/o tickle in back of throat, globus sensation, present for most of the morning into 2pm. PNdrainage. No dysphagia or trouble swallowing pills. Longstanding h/o allergic rhinitis symptoms, sharp HA behind eyes in the afternoons. Has tried OTC antihistamines and flonase as well as afrin. Both congestion and rhinorrhea/itchy watery eyes. No h/o asthma. Has seen allergist - negative allergy testing. Singulair seemed to help some previously.   Notes cold intolerance.  Lab Results  Component Value Date   TSH 2.81 10/19/2020   Sore to left cheek - saw derm ([facility]) 2 wks ago, dx skin cancer planned excisional biopsy in 2 wks.      Relevant past medical, surgical, family and social history reviewed and updated as indicated. Interim medical history since our last visit reviewed. Allergies and medications reviewed and updated. Outpatient Medications Prior to Visit  Medication Sig Dispense Refill   acetaminophen (TYLENOL) 325 MG tablet Take 650 mg by mouth every 6 (six) hours as needed.     metoprolol tartrate (LOPRESSOR) 25 MG tablet Take 0.5 tablets (12.5 mg total) by  mouth 2 (two) times daily.     clonazePAM (KLONOPIN) 0.5 MG tablet Take 0.5-1 tablets (0.25-0.5 mg total) by mouth daily as needed for anxiety. 30 tablet 0   acetaminophen-codeine (TYLENOL #3) 300-30 MG tablet Take 1 tablet by mouth 3 (three) times daily as needed for moderate pain. 15 tablet 0   escitalopram (LEXAPRO) 20 MG tablet Take 1 tablet (20 mg total) by mouth daily. (Patient not taking: Reported on 02/20/2022) 90 tablet 3   fluticasone (FLONASE) 50 MCG/ACT nasal spray Place 2 sprays into both nostrils daily. 16 g 5   No facility-administered medications prior to visit.     Per HPI unless specifically indicated in ROS section below Review of Systems  Objective:  BP 122/90   Pulse 66   Temp 98.3 F (36.8 C) (Temporal)   Ht '5\' 8"'$  (1.727 m)   Wt 179 lb 2 oz (81.3 kg)   SpO2 97%   BMI 27.24 kg/m   Wt Readings from Last 3 Encounters:  02/20/22 179 lb 2 oz (81.3 kg)  01/26/21 183 lb (83 kg)  10/19/20 177 lb 2 oz (80.3 kg)      Physical Exam Vitals and nursing note reviewed.  Constitutional:      Appearance: Normal appearance. He is not ill-appearing.  HENT:     Head: Normocephalic and atraumatic.     Right Ear: Tympanic membrane, ear canal and external ear normal. There is no impacted  cerumen.     Left Ear: Tympanic membrane, ear canal and external ear normal. There is no impacted cerumen.     Nose: Congestion present. No rhinorrhea.     Right Turbinates: Swollen. Not enlarged or pale.     Left Turbinates: Swollen. Not enlarged or pale.     Right Sinus: Maxillary sinus tenderness present. No frontal sinus tenderness.     Left Sinus: Maxillary sinus tenderness present. No frontal sinus tenderness.     Mouth/Throat:     Mouth: Mucous membranes are moist.     Pharynx: No oropharyngeal exudate or posterior oropharyngeal erythema.  Eyes:     Extraocular Movements: Extraocular movements intact.     Conjunctiva/sclera: Conjunctivae normal.     Pupils: Pupils are equal, round,  and reactive to light.  Neck:     Thyroid: No thyroid mass, thyromegaly or thyroid tenderness.  Cardiovascular:     Rate and Rhythm: Normal rate and regular rhythm.     Pulses: Normal pulses.     Heart sounds: Normal heart sounds. No murmur heard. Pulmonary:     Effort: Pulmonary effort is normal. No respiratory distress.     Breath sounds: Normal breath sounds. No wheezing, rhonchi or rales.  Musculoskeletal:        General: Normal range of motion.     Cervical back: Normal range of motion and neck supple.     Right lower leg: No edema.     Left lower leg: No edema.  Lymphadenopathy:     Cervical: No cervical adenopathy.  Skin:    General: Skin is warm and dry.     Findings: No rash.  Neurological:     Mental Status: He is alert.  Psychiatric:        Mood and Affect: Mood normal.        Behavior: Behavior normal.       Results for orders placed or performed in visit on 10/19/20  TSH  Result Value Ref Range   TSH 2.81 0.35 - 4.50 uIU/mL  T4, free  Result Value Ref Range   Free T4 0.84 0.60 - 1.60 ng/dL      02/20/2022    8:50 AM 01/26/2021    8:40 AM 10/19/2020   12:15 PM 07/21/2020   11:52 AM 06/25/2020    8:38 AM  Depression screen PHQ 2/9  Decreased Interest 0 '2 2 1 3  '$ Down, Depressed, Hopeless 0 '1 1 2 2  '$ PHQ - 2 Score 0 '3 3 3 5  '$ Altered sleeping 0 '3 2 2 '$ 0  Tired, decreased energy '1 3 1 2 1  '$ Change in appetite 2 2 0 1 0  Feeling bad or failure about yourself  0 0 1 2 0  Trouble concentrating 0 0 0 2 2  Moving slowly or fidgety/restless 0 0 1 0 0  Suicidal thoughts 0 0 0 0 0  PHQ-9 Score '3 11 8 12 8  '$ Difficult doing work/chores Not difficult at all   Very difficult Extremely dIfficult       02/20/2022    8:50 AM 01/26/2021    8:40 AM 10/19/2020   12:15 PM 07/21/2020   11:52 AM  GAD 7 : Generalized Anxiety Score  Nervous, Anxious, on Edge '3 3 2 3  '$ Control/stop worrying 2 1 0 2  Worry too much - different things 1 2 0 2  Trouble relaxing '3 3 3 3  '$ Restless 0  0 0 1  Easily annoyed or irritable  2 0 1 1  Afraid - awful might happen 2 2 0 3  Total GAD 7 Score '13 11 6 15  '$ Anxiety Difficulty Very difficult      Assessment & Plan:   Problem List Items Addressed This Visit     GAD (generalized anxiety disorder) - Primary    Ongoing difficulty. Lexapro higher dose caused worsening anxiety.  Will trial buspar, reviewed possible side effects of medication.  Continue klonopin PRN.  Update with effect.       Relevant Medications   clonazePAM (KLONOPIN) 0.5 MG tablet   busPIRone (BUSPAR) 7.5 MG tablet   Allergic rhinitis    OTC antihistamines ineffective.  rec continue flonase, add singulair, update with effect. Discussed limiting afrin use.       Palpitations    Sees cardiology, now on metoprolol.       Globus sensation    Anticipate allergic rhinitis related, r/o GERD. See above.         Meds ordered this encounter  Medications   clonazePAM (KLONOPIN) 0.5 MG tablet    Sig: Take 0.5-1 tablets (0.25-0.5 mg total) by mouth daily as needed for anxiety.    Dispense:  30 tablet    Refill:  0   montelukast (SINGULAIR) 10 MG tablet    Sig: Take 1 tablet (10 mg total) by mouth at bedtime.    Dispense:  30 tablet    Refill:  3   busPIRone (BUSPAR) 7.5 MG tablet    Sig: Take 1 tablet (7.5 mg total) by mouth 2 (two) times daily.    Dispense:  60 tablet    Refill:  3   No orders of the defined types were placed in this encounter.    Patient Instructions  Klonopin refilled.  Trial buspar 7.'5mg'$  twice daily - start nightly only.  Restart nasal steroid like flonase OTC.  Trial singulair daily - sent to pharmacy.  Good to see you today.  Return as needed or in 6 months for physical, prior fasting for labs.   Follow up plan: Return in about 6 months (around 08/22/2022) for annual exam, prior fasting for blood work.  Ria Bush, MD

## 2022-02-20 NOTE — Assessment & Plan Note (Signed)
Sees cardiology, now on metoprolol.

## 2022-02-20 NOTE — Patient Instructions (Addendum)
Klonopin refilled.  Trial buspar 7.'5mg'$  twice daily - start nightly only.  Restart nasal steroid like flonase OTC.  Trial singulair daily - sent to pharmacy.  Good to see you today.  Return as needed or in 6 months for physical, prior fasting for labs.

## 2022-02-20 NOTE — Assessment & Plan Note (Signed)
Ongoing difficulty. Lexapro higher dose caused worsening anxiety.  Will trial buspar, reviewed possible side effects of medication.  Continue klonopin PRN.  Update with effect.

## 2022-02-20 NOTE — Assessment & Plan Note (Addendum)
Anticipate allergic rhinitis related, r/o GERD. See above.

## 2022-02-20 NOTE — Assessment & Plan Note (Signed)
OTC antihistamines ineffective.  rec continue flonase, add singulair, update with effect. Discussed limiting afrin use.

## 2022-05-15 ENCOUNTER — Encounter: Payer: Self-pay | Admitting: Family Medicine

## 2022-05-15 DIAGNOSIS — H3561 Retinal hemorrhage, right eye: Secondary | ICD-10-CM | POA: Insufficient documentation

## 2022-06-07 ENCOUNTER — Ambulatory Visit
Admission: RE | Admit: 2022-06-07 | Discharge: 2022-06-07 | Disposition: A | Discharge status: Home / Self Care | Payer: 59 | Source: Ambulatory Visit

## 2022-06-07 ENCOUNTER — Ambulatory Visit (INDEPENDENT_AMBULATORY_CARE_PROVIDER_SITE_OTHER): Payer: 59

## 2022-06-07 VITALS — BP 137/88 | HR 68 | Temp 98.4°F | Resp 16

## 2022-06-07 DIAGNOSIS — S8011XA Contusion of right lower leg, initial encounter: Secondary | ICD-10-CM

## 2022-06-07 DIAGNOSIS — S2241XA Multiple fractures of ribs, right side, initial encounter for closed fracture: Secondary | ICD-10-CM

## 2022-06-07 MED ORDER — CYCLOBENZAPRINE HCL 10 MG PO TABS: 10.0000 mg | ORAL_TABLET | Freq: Three times a day (TID) | ORAL | 0 refills | Status: AC | PRN

## 2022-06-07 MED ORDER — TRAMADOL HCL 50 MG PO TABS: 50.0000 mg | ORAL_TABLET | Freq: Two times a day (BID) | ORAL | 0 refills | Status: AC | PRN
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
## 2022-06-22 ENCOUNTER — Ambulatory Visit
Admission: RE | Admit: 2022-06-22 | Discharge: 2022-06-22 | Disposition: A | Discharge status: Home / Self Care | Payer: 59 | Source: Ambulatory Visit | Attending: Family Medicine | Admitting: Family Medicine

## 2022-06-22 ENCOUNTER — Other Ambulatory Visit: Payer: Self-pay

## 2022-06-22 VITALS — BP 128/86 | HR 75 | Temp 98.0°F | Resp 20

## 2022-06-22 DIAGNOSIS — T148XXA Other injury of unspecified body region, initial encounter: Secondary | ICD-10-CM

## 2022-06-22 DIAGNOSIS — S7011XA Contusion of right thigh, initial encounter: Secondary | ICD-10-CM
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
## 2022-06-22 NOTE — Discharge Instructions (Signed)
The bruising on the right thigh is continuing to heal at this time.  Healing appears to be appropriate. As discussed, I cannot confirm the presence of a blood clot to the right thigh.  If you develop increasing pain, warmth, swelling, redness, or tenderness to the right thigh, recommend that you go to the emergency department for further evaluation.  Also, if you develop shortness of breath, difficulty breathing, these or other indications that you possibly could have a blood clot. Follow-up as needed.

## 2022-09-12 ENCOUNTER — Other Ambulatory Visit: Payer: Self-pay | Admitting: Family Medicine

## 2022-09-12 DIAGNOSIS — F411 Generalized anxiety disorder: Secondary | ICD-10-CM

## 2022-09-13 NOTE — Telephone Encounter (Signed)
LOV 02/20/22 Next OV   Return as needed or in 6 months for physical, prior fasting for labs/appt not scheduled  Last refill 02/20/22, #30, 0 refills  Please review, thanks!

## 2022-09-14 MED ORDER — CLONAZEPAM 0.5 MG PO TABS: 0.2500 mg | ORAL_TABLET | Freq: Every day | ORAL | 0 refills | Status: DC | PRN

## 2022-09-14 NOTE — Telephone Encounter (Signed)
ERx 

## 2023-07-05 ENCOUNTER — Other Ambulatory Visit: Payer: Self-pay | Admitting: Family Medicine

## 2023-07-05 DIAGNOSIS — F411 Generalized anxiety disorder: Secondary | ICD-10-CM

## 2023-07-05 NOTE — Telephone Encounter (Signed)
Patient scheduled.

## 2023-07-05 NOTE — Telephone Encounter (Signed)
Last seen 2023. Plz schedule OV preferably CPE then route back to me for refill

## 2023-07-05 NOTE — Telephone Encounter (Signed)
Name of Medication:  Clonazepam Name of Pharmacy:  [facility] Last Fill or Written Date and Quantity:  09/14/22, #30 Last Office Visit and Type:  02/20/22, GAD f/u Next Office Visit and Type:  none Last Controlled Substance Agreement Date:  none Last UDS:  none

## 2023-07-09 MED ORDER — CLONAZEPAM 0.5 MG PO TABS: 0.2500 mg | ORAL_TABLET | Freq: Every day | ORAL | 0 refills | Status: DC | PRN

## 2023-07-09 NOTE — Telephone Encounter (Signed)
ERx 

## 2023-08-24 ENCOUNTER — Other Ambulatory Visit: Payer: Self-pay | Admitting: Family Medicine

## 2023-08-24 DIAGNOSIS — F411 Generalized anxiety disorder: Secondary | ICD-10-CM

## 2023-08-24 NOTE — Telephone Encounter (Signed)
Name of Medication:  Clonazepam Name of Pharmacy:  [facility] Last [place] Ahr or Written Date and Quantity:  07/09/23, #30 Last Office Visit and Type:  02/20/22, GAD f/u Next Office Visit and Type:  09/26/23, CPE Last Controlled Substance Agreement Date:  none Last UDS:  none

## 2023-08-28 NOTE — Telephone Encounter (Signed)
ERx 

## 2023-09-15 ENCOUNTER — Other Ambulatory Visit: Payer: Self-pay | Admitting: Family Medicine

## 2023-09-15 DIAGNOSIS — E781 Pure hyperglyceridemia: Secondary | ICD-10-CM | POA: Insufficient documentation

## 2023-09-15 DIAGNOSIS — E538 Deficiency of other specified B group vitamins: Secondary | ICD-10-CM

## 2023-09-15 DIAGNOSIS — E041 Nontoxic single thyroid nodule: Secondary | ICD-10-CM

## 2023-09-15 DIAGNOSIS — Z125 Encounter for screening for malignant neoplasm of prostate: Secondary | ICD-10-CM

## 2023-09-15 DIAGNOSIS — Z131 Encounter for screening for diabetes mellitus: Secondary | ICD-10-CM

## 2023-09-19 ENCOUNTER — Other Ambulatory Visit (INDEPENDENT_AMBULATORY_CARE_PROVIDER_SITE_OTHER): Payer: 59

## 2023-09-19 DIAGNOSIS — Z131 Encounter for screening for diabetes mellitus: Secondary | ICD-10-CM | POA: Diagnosis not present

## 2023-09-19 DIAGNOSIS — E538 Deficiency of other specified B group vitamins: Secondary | ICD-10-CM

## 2023-09-19 DIAGNOSIS — E781 Pure hyperglyceridemia: Secondary | ICD-10-CM | POA: Diagnosis not present

## 2023-09-19 DIAGNOSIS — E041 Nontoxic single thyroid nodule: Secondary | ICD-10-CM | POA: Diagnosis not present

## 2023-09-19 DIAGNOSIS — Z125 Encounter for screening for malignant neoplasm of prostate: Secondary | ICD-10-CM | POA: Diagnosis not present

## 2023-09-19 LAB — LIPID PANEL
Cholesterol: 209 mg/dL — ABNORMAL HIGH (ref 0–200)
HDL: 51.4 mg/dL (ref >39.00)
LDL Cholesterol: 141 mg/dL — ABNORMAL HIGH (ref 0–99)
NonHDL: 157.25
Total CHOL/HDL Ratio: 4
Triglycerides: 79 mg/dL (ref 0.0–149.0)
VLDL: 15.8 mg/dL (ref 0.0–40.0)

## 2023-09-19 LAB — BASIC METABOLIC PANEL
BUN: 20 mg/dL (ref 6–23)
CO2: 30 meq/L (ref 19–32)
Calcium: 9.7 mg/dL (ref 8.4–10.5)
Chloride: 103 meq/L (ref 96–112)
Creatinine, Ser: 0.95 mg/dL (ref 0.40–1.50)
GFR: 87.86 mL/min (ref >60.00)
Glucose, Bld: 97 mg/dL (ref 70–99)
Potassium: 4.2 meq/L (ref 3.5–5.1)
Sodium: 140 meq/L (ref 135–145)

## 2023-09-19 LAB — HEPATIC FUNCTION PANEL
ALT: 16 U/L (ref 0–53)
AST: 16 U/L (ref 0–37)
Albumin: 4.9 g/dL (ref 3.5–5.2)
Alkaline Phosphatase: 55 U/L (ref 39–117)
Bilirubin, Direct: 0.1 mg/dL (ref 0.0–0.3)
Total Bilirubin: 0.7 mg/dL (ref 0.2–1.2)
Total Protein: 7 g/dL (ref 6.0–8.3)

## 2023-09-19 LAB — VITAMIN B12: Vitamin B-12: 527 pg/mL (ref 211–911)

## 2023-09-19 LAB — TSH: TSH: 2.66 u[IU]/mL (ref 0.35–5.50)

## 2023-09-19 LAB — PSA: PSA: 0.68 ng/mL (ref 0.10–4.00)

## 2023-09-26 ENCOUNTER — Encounter: Payer: 59 | Admitting: Family Medicine

## 2023-11-09 ENCOUNTER — Other Ambulatory Visit: Payer: Self-pay | Admitting: Student

## 2023-11-09 DIAGNOSIS — I471 Supraventricular tachycardia, unspecified: Secondary | ICD-10-CM

## 2023-11-09 DIAGNOSIS — R0602 Shortness of breath: Secondary | ICD-10-CM

## 2023-11-14 ENCOUNTER — Encounter: Payer: 59 | Admitting: Family Medicine

## 2023-11-23 ENCOUNTER — Other Ambulatory Visit

## 2023-11-24 ENCOUNTER — Ambulatory Visit: Payer: Self-pay

## 2024-06-11 NOTE — Progress Notes (Addendum)
 Chief Complaint: Chief Complaint  Patient presents with  . Back Pain      HPI: Patient is a pleasant 58 y.o. male seen as a self-referral for evaluation of back pain traveling into the bilateral legs.  Pain started approximately 11 weeks ago while he was removing leaf clippings as well as a large branch.  He felt the pain in his back.  He rates his pain as a 9/10.  It is constant, sharp, dull, stiff, aching, burning.  He denies any numbness, tingling, weakness or loss of control of bowel or bladder.  Pain is worse with twisting, sitting, coughing, sneezing and better with standing, heat and ice.  He also complains of neck pain as well as palpitations.   Review of Systems: A 10 point review of systems is negative, except for the pertinent positives and negatives detailed in the HPI.  PMH: Past Medical History:  Diagnosis Date  . Anxiety   . Palpitations   . Supraventricular tachycardia ([facility])      PSH: History reviewed. No pertinent surgical history.   Family History: No family history on file.   Social History: Social History   Socioeconomic History  . Marital status: Married  Tobacco Use  . Smoking status: Never  . Smokeless tobacco: Never  Vaping Use  . Vaping status: Never Used  Substance and Sexual Activity  . Alcohol use: Never  . Drug use: Never   Social Drivers of Corporate Investment Banker Strain: Low Risk  (06/11/2024)   Overall Financial Resource Strain (CARDIA)   . Difficulty of Paying Living Expenses: Not hard at all  Food Insecurity: No Food Insecurity (06/11/2024)   Hunger Vital Sign   . Worried About Programme Researcher, Broadcasting/film/video in the Last Year: Never true   . Ran Out of Food in the Last Year: Never true  Transportation Needs: No Transportation Needs (06/11/2024)   PRAPARE - Transportation   . Lack of Transportation (Medical): No   . Lack of Transportation (Non-Medical): No     Allergies: Allergies  Allergen Reactions  .  Sulfamethoxazole -Trimethoprim  Rash     Medications:  Current Outpatient Medications:  .  fluticasone  propionate (FLONASE ) 50 mcg/actuation nasal spray, Place 2 sprays into both nostrils as needed, Disp: , Rfl:  .  metoprolol tartrate (LOPRESSOR) 25 MG tablet, TAKE 1 TABLET(25 MG) BY MOUTH TWICE DAILY, Disp: 180 tablet, Rfl: 3 .  clonazePAM  (KLONOPIN ) 0.5 MG tablet, Take 0.25-0.5 mg by mouth as needed (Patient not taking: Reported on 06/11/2024), Disp: , Rfl:  .  gabapentin (NEURONTIN) 100 MG capsule, 1 po qHS x 4 days, then bid x 4 days, then tid, Disp: 90 capsule, Rfl: 0 .  oxymetazoline (AFRIN) 0.05 % nasal spray, Place 1 spray into both nostrils as needed for Congestion (Patient not taking: Reported on 06/11/2024), Disp: , Rfl:   Vitals: Vitals:   06/11/24 0838  BP: (!) 141/93  Pulse: 73  Temp: 36.1 C (97 F)  TempSrc: Oral  Weight: 72.9 kg (160 lb 11.5 oz)  Height: 172.7 cm (5' 7.99)  PainSc:   8  PainLoc: Back     Physical Exam: Constitutional: vital signs reviewed, healthy appearing  Psych: normal affect and mood, pain behaviors consistent throughout exam, appropriate judgement and insight  Skin: no rashes or lesions, on palpation there are no palpable nodules or areas of induration  Resp: even and unlabored, no use of accessory muscles, no tactile fremitis  Cardiac: no clubbing, cyanosis, edema  GI:  nondistended,  nontender  MSK: normal inspection lumbar spine, + TTP lower lumbar spinous processes, limitation with lumbar flexion due to pain but full extension, negative seated slump bilaterally Neuro: sensation to soft touch intact BL LE, motor exam 5/5 both legs, reflexes 2+ BL patella   Imaging: MRI lumbar spine done at [facility] 2006: L3-4 diffuse disc bulge with mild stenosis; L4-5 extruded disc fragment with impression on the anterior lateral recess of the thecal sac  GFR 87 drawn on 09/19/2023  Assessment: Chronic low back pain with bilateral lumbar  radiculitis History of lumbar discectomy 2006 History of hypertension/SVT  Plan: I did review the MRI lumbar spine.  Please see above.  I also reviewed the CMP from 09/19/2023.  Please see above.  I did discuss likely pathology as well as treatment options including physical therapy.  He is interested in considering the PT.  Referral placed to physical therapy.  I did also offer an MRI lumbar spine but he would like to hold off on that to see first how he does with PT.  In terms of medications, I did offer a trial of gabapentin.  He wishes to not hear about the side effects so I did not go over that with him.  I did discuss with him how to slowly titrate up medication as well as not to take the medication while he is either driving or operating heavy machinery.  He verbalized understanding.  Prescription for gabapentin 100 mg to be slowly titrate up to 3 times a day sent to pharmacy.  Follow-up 2 to 3 weeks.  Will consider further titrating gabapentin.  I will see if he has started with PT.  Will consider MRI lumbar spine.  Patient agrees with above plan.  Answered all questions.

## 2024-07-09 ENCOUNTER — Telehealth: Payer: Self-pay

## 2024-07-09 NOTE — Telephone Encounter (Signed)
 Copied from CRM #[id number]. Topic: Appointments - Transfer of Care >> Jul 09, 2024 11:29 AM Hadassah PARAS wrote: Pt is requesting to transfer FROM: [id number]  Pt is requesting to transfer TO: - Clarissa A Zafirov, MD Reason for requested transfer: Pt does not like dr garlon  It is the responsibility of the team the patient would like to transfer to (Dr. GLENWOOD Parris DELENA Everlene, MD) to reach out to the patient if for any reason this transfer is not acceptable.

## 2024-07-29 ENCOUNTER — Encounter
